# Patient Record
Sex: Male | Born: 1955 | Race: White | Hispanic: No | Marital: Married | Smoking: Current every day smoker
Health system: Southern US, Community
[De-identification: ages and names within clinical notes are randomized; demographics above are authoritative.]

## PROBLEM LIST (undated history)

## (undated) DIAGNOSIS — T7840XA Allergy, unspecified, initial encounter: Secondary | ICD-10-CM

## (undated) DIAGNOSIS — I10 Essential (primary) hypertension: Secondary | ICD-10-CM

## (undated) DIAGNOSIS — E785 Hyperlipidemia, unspecified: Secondary | ICD-10-CM

## (undated) DIAGNOSIS — I219 Acute myocardial infarction, unspecified: Secondary | ICD-10-CM

## (undated) HISTORY — DX: Allergy, unspecified, initial encounter: T78.40XA

## (undated) HISTORY — DX: Hyperlipidemia, unspecified: E78.5

## (undated) HISTORY — PX: CORONARY ARTERY BYPASS GRAFT: SHX141

## (undated) HISTORY — DX: Essential (primary) hypertension: I10

## (undated) HISTORY — DX: Acute myocardial infarction, unspecified: I21.9

## (undated) HISTORY — PX: CARDIAC SURGERY: SHX584

---

## 2008-03-31 ENCOUNTER — Emergency Department (HOSPITAL_COMMUNITY): Admission: EM | Admit: 2008-03-31 | Discharge: 2008-03-31 | Payer: Self-pay | Admitting: Emergency Medicine

## 2008-04-12 ENCOUNTER — Emergency Department (HOSPITAL_COMMUNITY): Admission: EM | Admit: 2008-04-12 | Discharge: 2008-04-12 | Payer: Self-pay | Admitting: Emergency Medicine

## 2009-11-22 ENCOUNTER — Inpatient Hospital Stay (HOSPITAL_COMMUNITY): Admission: EM | Admit: 2009-11-22 | Discharge: 2009-11-25 | Payer: Self-pay | Admitting: Emergency Medicine

## 2009-12-16 ENCOUNTER — Encounter (HOSPITAL_COMMUNITY)
Admission: RE | Admit: 2009-12-16 | Discharge: 2010-03-09 | Payer: Self-pay | Source: Home / Self Care | Admitting: Cardiology

## 2009-12-16 IMAGING — CT CT ABDOMEN W/O CM
1 of 2 series · 15 of 32 positions shown, 19 images · non-contrast
Comparison: None

CT ABDOMEN

CLINICAL DATA: Left leg pain

CT ABDOMEN AND PELVIS WITHOUT CONTRAST
TECHNIQUE: Multidetector CT imaging of the abdomen and pelvis was
performed following the standard
protocol without intravenous contrast.

[Series 2: 220 stone 5.0 b40s st · axial · 0.89mm/px · z∈[+1298,+1698]mm · 15 of 88 slices shown, 19 images]
[im 4/88  soft-tissue]
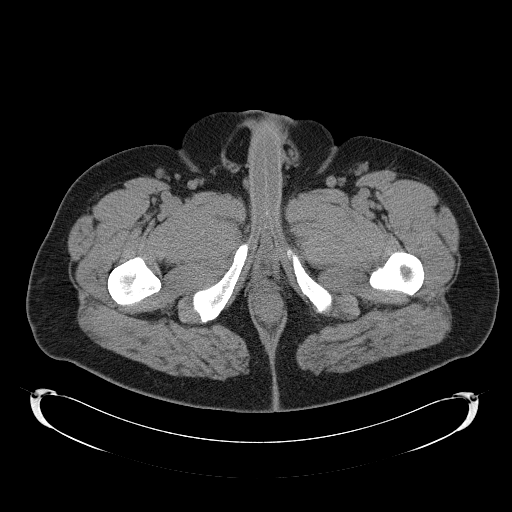
[im 4/88  bone]
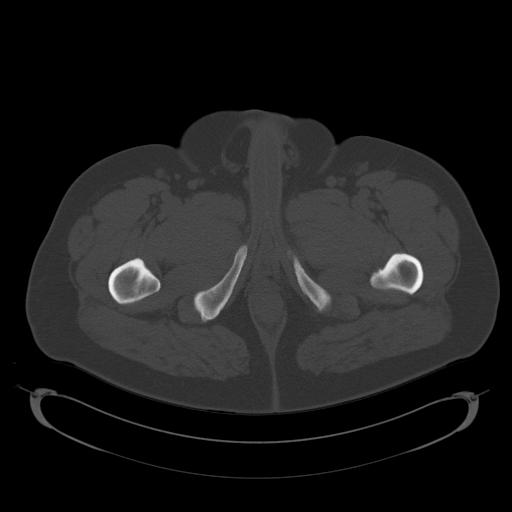
[im 12/88  soft-tissue]
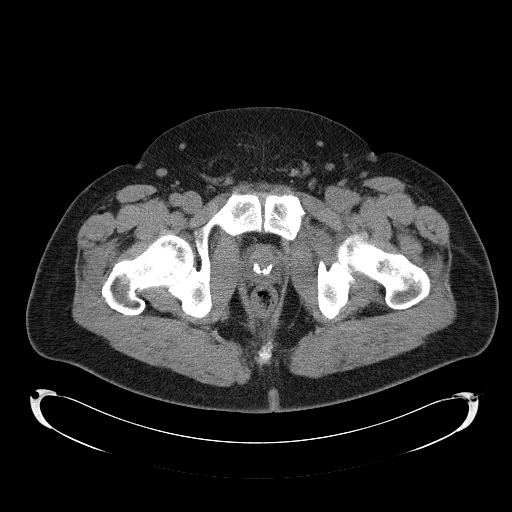
[im 20/88  soft-tissue]
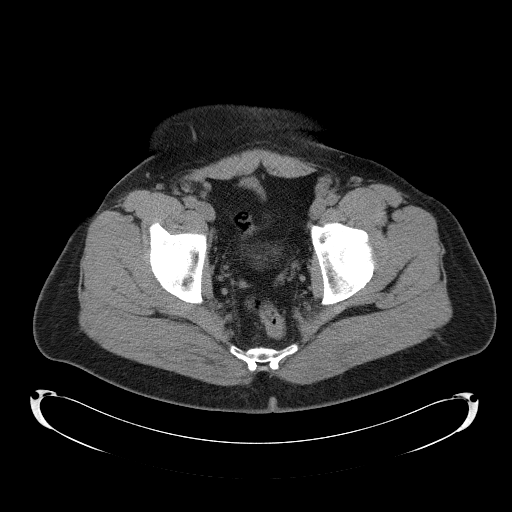
[im 24/88  soft-tissue]
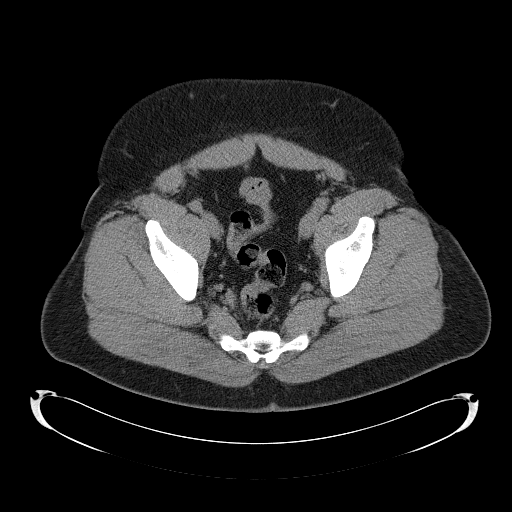
[im 32/88  soft-tissue]
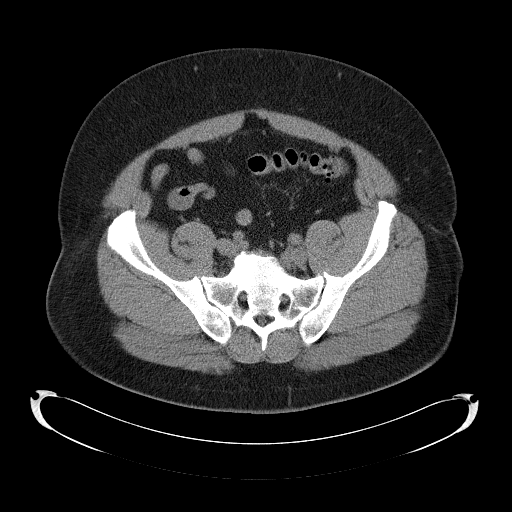
[im 36/88  soft-tissue]
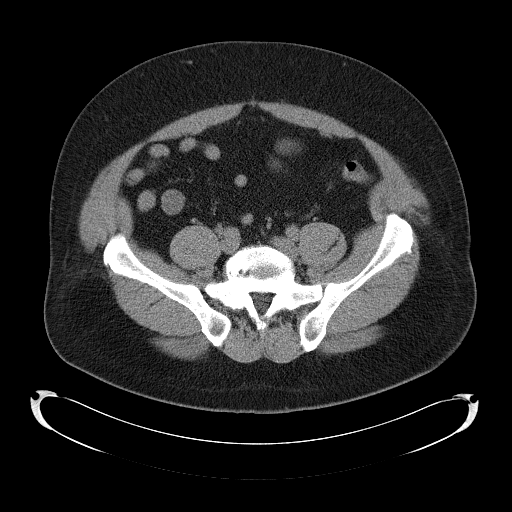
[im 44/88  soft-tissue]
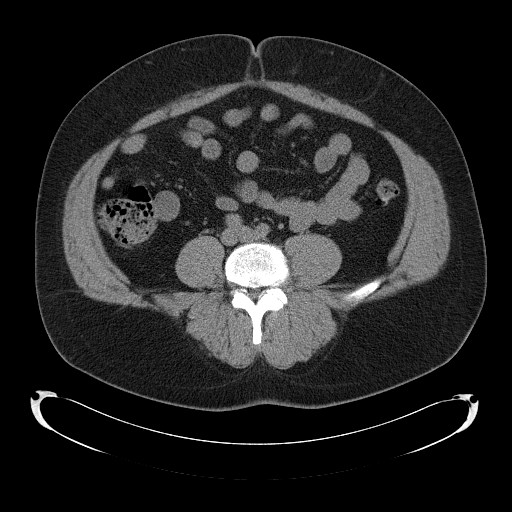
[im 52/88  soft-tissue]
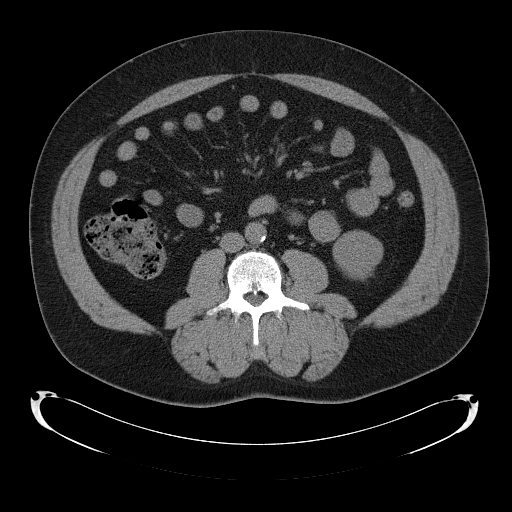
[im 56/88  soft-tissue]
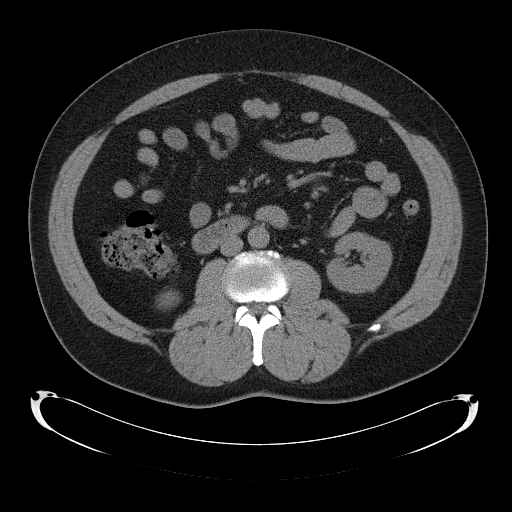
[im 56/88  bone]
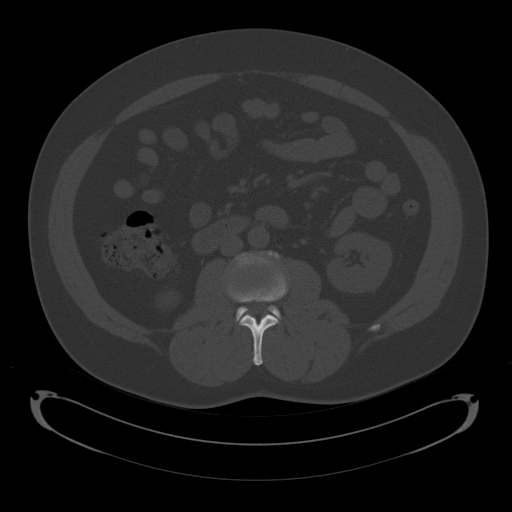
[im 64/88  soft-tissue]
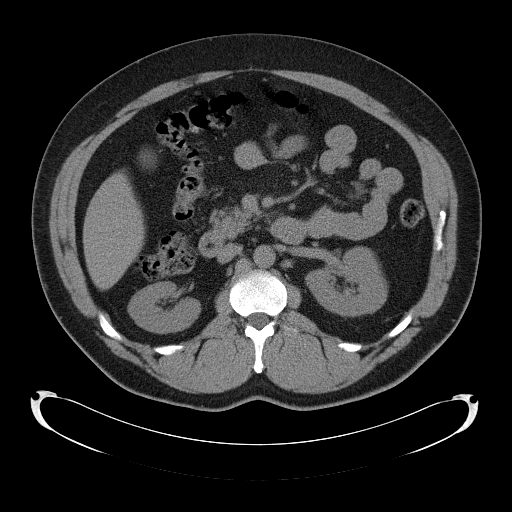
[im 68/88  soft-tissue]
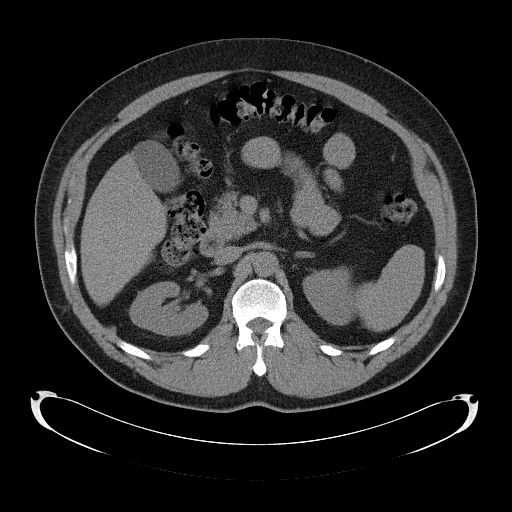
[im 72/88  lung]
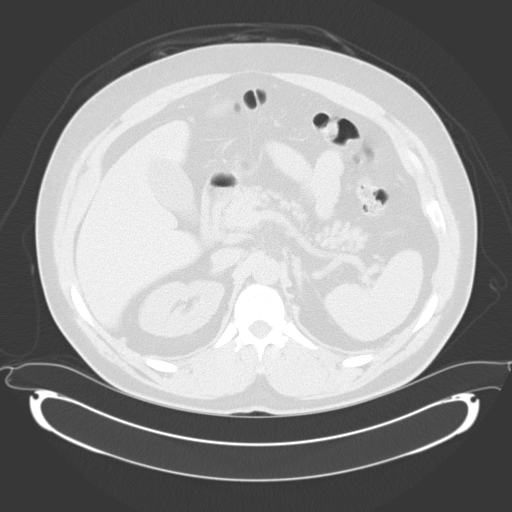
[im 76/88  soft-tissue]
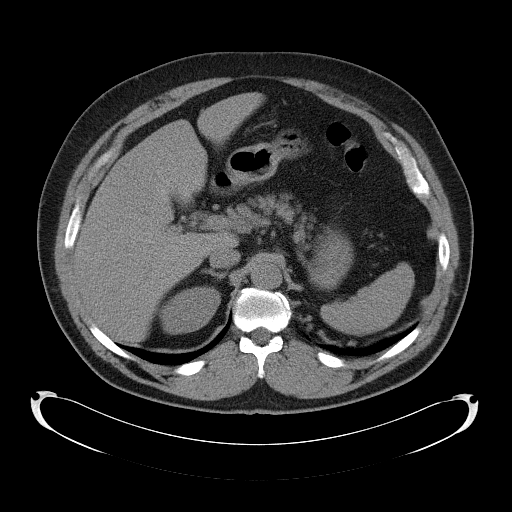
[im 76/88  lung]
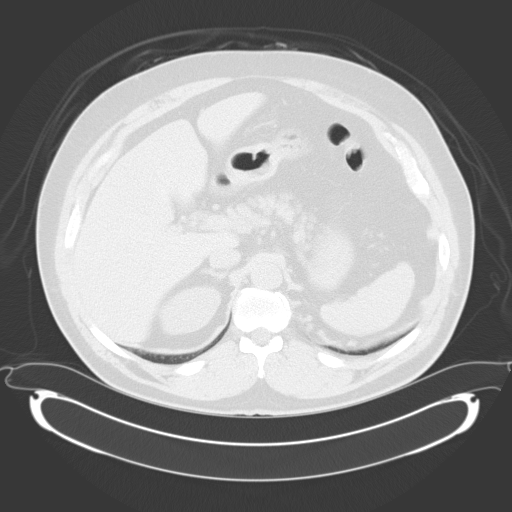
[im 80/88  lung]
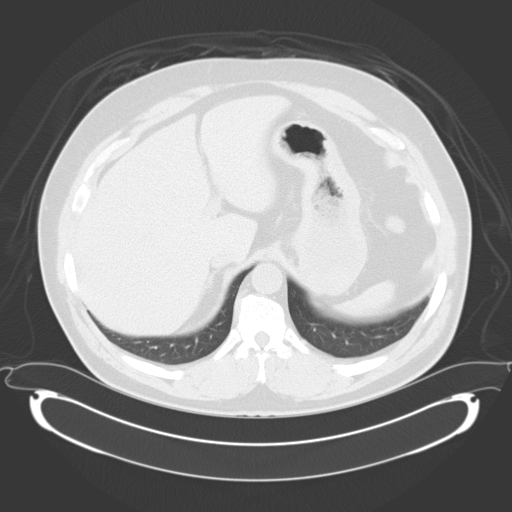
[im 84/88  soft-tissue]
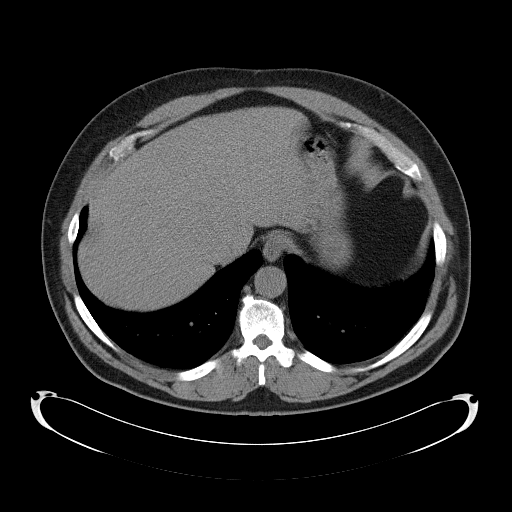
[im 84/88  lung]
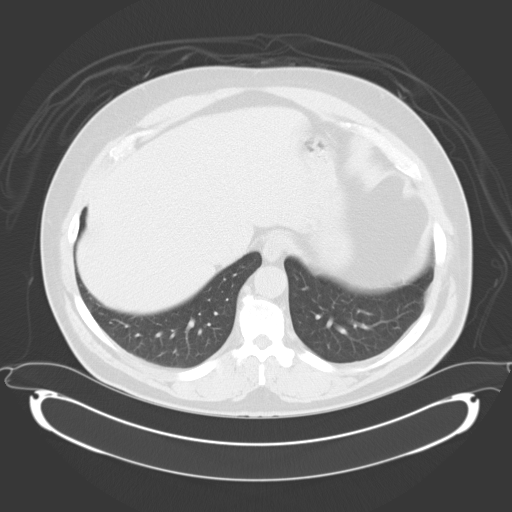

[15 of 32 positions shown; findings below may reference images not displayed]

FINDINGS: A subpleural nodule is visualized within the post
lateral segment left lower lung, image #1..  No pericardial or
pleural effusion.  The liver gallbladder and spleen are
unremarkable.  Incidental accessory spleen.

The kidneys are symmetric without hydronephrosis.  No renal calculi
are appreciated.  The ureters are nondilated.

The pancreas is normal. The adrenal glands are normal.  The
gallbladder is normal caliber.

Scattered diverticular changes seen within the descending colon.
There is subtle pericolonic mesenteric fat stranding which may
represent uncomplicated diverticulitis.

Thoracolumbar degenerative changes.
IMPRESSION: 1.  Subpleural nodule left lung post lateral basal segment.  Short-
term follow-up CT suggested.  Recommend CT chest 4- 6 weeks.
2.  Question uncomplicated diverticulitis.
3.  Negative for hydronephrosis.

CT PELVIS
FINDINGS: The distal ureters decompressed.  No distal ureteral
calculi are seen.  Few scattered vascular calcifications are noted.
The bladder is normal.  No free fluid.  Degenerative changes lower
lumbar spine.
IMPRESSION: Negative

## 2010-01-05 ENCOUNTER — Ambulatory Visit (HOSPITAL_COMMUNITY): Admission: RE | Admit: 2010-01-05 | Discharge: 2010-01-06 | Payer: Self-pay | Admitting: Cardiology

## 2010-09-19 LAB — BASIC METABOLIC PANEL
BUN: 9 mg/dL (ref 6–23)
Creatinine, Ser: 0.87 mg/dL (ref 0.4–1.5)
GFR calc non Af Amer: >60 mL/min (ref >60)
Potassium: 4 mEq/L (ref 3.5–5.1)

## 2010-09-19 LAB — CK TOTAL AND CKMB (NOT AT ARMC)
Relative Index: 3 — ABNORMAL HIGH (ref 0.0–2.5)
Total CK: 105 U/L (ref 7–232)

## 2010-09-19 LAB — CBC
HCT: 39.8 % (ref 39.0–52.0)
Hemoglobin: 13.7 g/dL (ref 13.0–17.0)
MCHC: 34.5 g/dL (ref 30.0–36.0)
MCV: 94.1 fL (ref 78.0–100.0)
Platelets: 287 10*3/uL (ref 150–400)
RBC: 4.23 MIL/uL (ref 4.22–5.81)
RDW: 12.4 % (ref 11.5–15.5)

## 2010-09-20 LAB — MAGNESIUM: Magnesium: 2.1 mg/dL (ref 1.5–2.5)

## 2010-09-20 LAB — BASIC METABOLIC PANEL
BUN: 10 mg/dL (ref 6–23)
BUN: 13 mg/dL (ref 6–23)
CO2: 25 mEq/L (ref 19–32)
CO2: 28 mEq/L (ref 19–32)
CO2: 28 mEq/L (ref 19–32)
Calcium: 8.3 mg/dL — ABNORMAL LOW (ref 8.4–10.5)
Calcium: 8.7 mg/dL (ref 8.4–10.5)
Calcium: 8.9 mg/dL (ref 8.4–10.5)
Chloride: 106 mEq/L (ref 96–112)
Chloride: 107 mEq/L (ref 96–112)
GFR calc Af Amer: >60 mL/min (ref >60)
GFR calc Af Amer: >60 mL/min (ref >60)
GFR calc non Af Amer: >60 mL/min (ref >60)
GFR calc non Af Amer: >60 mL/min (ref >60)
GFR calc non Af Amer: >60 mL/min (ref >60)
Glucose, Bld: 99 mg/dL (ref 70–99)
Potassium: 3.9 mEq/L (ref 3.5–5.1)
Potassium: 4.1 mEq/L (ref 3.5–5.1)
Potassium: 5.8 mEq/L — ABNORMAL HIGH (ref 3.5–5.1)
Sodium: 137 mEq/L (ref 135–145)
Sodium: 137 mEq/L (ref 135–145)
Sodium: 139 mEq/L (ref 135–145)

## 2010-09-20 LAB — CBC
HCT: 42.3 % (ref 39.0–52.0)
HCT: 43.7 % (ref 39.0–52.0)
Hemoglobin: 14.9 g/dL (ref 13.0–17.0)
MCHC: 35.2 g/dL (ref 30.0–36.0)
MCHC: 35.3 g/dL (ref 30.0–36.0)
MCV: 95.3 fL (ref 78.0–100.0)
RBC: 4.47 MIL/uL (ref 4.22–5.81)
RBC: 4.69 MIL/uL (ref 4.22–5.81)
WBC: 10.1 10*3/uL (ref 4.0–10.5)
WBC: 8.6 10*3/uL (ref 4.0–10.5)
WBC: 9.3 10*3/uL (ref 4.0–10.5)

## 2010-09-20 LAB — COMPREHENSIVE METABOLIC PANEL
ALT: 18 U/L (ref 0–53)
AST: 33 U/L (ref 0–37)
Alkaline Phosphatase: 43 U/L (ref 39–117)
BUN: 13 mg/dL (ref 6–23)
Creatinine, Ser: 0.92 mg/dL (ref 0.4–1.5)
GFR calc non Af Amer: >60 mL/min (ref >60)
Total Bilirubin: 0.6 mg/dL (ref 0.3–1.2)

## 2010-09-20 LAB — CARDIAC PANEL(CRET KIN+CKTOT+MB+TROPI)
CK, MB: 11.3 ng/mL (ref 0.3–4.0)
CK, MB: 2.7 ng/mL (ref 0.3–4.0)
Relative Index: 2.3 (ref 0.0–2.5)
Total CK: 176 U/L (ref 7–232)

## 2010-09-20 LAB — CK TOTAL AND CKMB (NOT AT ARMC): Total CK: 296 U/L — ABNORMAL HIGH (ref 7–232)

## 2010-09-20 LAB — LIPID PANEL
Cholesterol: 213 mg/dL — ABNORMAL HIGH (ref 0–200)
HDL: 38 mg/dL — ABNORMAL LOW (ref >39)
LDL Cholesterol: UNDETERMINED mg/dL (ref 0–99)
Triglycerides: 416 mg/dL — ABNORMAL HIGH (ref <150)

## 2010-09-20 LAB — PROTIME-INR
INR: 0.97 (ref 0.00–1.49)
Prothrombin Time: 12.8 seconds (ref 11.6–15.2)

## 2010-09-20 LAB — POCT CARDIAC MARKERS
Myoglobin, poc: 200 ng/mL (ref 12–200)
Troponin i, poc: <0.05 ng/mL (ref 0.00–0.09)

## 2010-09-20 LAB — URINALYSIS, ROUTINE W REFLEX MICROSCOPIC
Hgb urine dipstick: NEGATIVE
Ketones, ur: 15 mg/dL — AB
Nitrite: NEGATIVE
Protein, ur: NEGATIVE mg/dL
Specific Gravity, Urine: 1.027 (ref 1.005–1.030)
Urobilinogen, UA: 0.2 mg/dL (ref 0.0–1.0)

## 2010-09-20 LAB — APTT: aPTT: 63 seconds — ABNORMAL HIGH (ref 24–37)

## 2010-09-20 LAB — PLATELET INHIBITION P2Y12: Platelet Function  P2Y12: 265 [PRU] (ref 194–418)

## 2010-09-20 LAB — DIFFERENTIAL
Basophils Absolute: 0 10*3/uL (ref 0.0–0.1)
Basophils Relative: 0 % (ref 0–1)
Eosinophils Relative: 2 % (ref 0–5)
Monocytes Absolute: 1 10*3/uL (ref 0.1–1.0)
Neutrophils Relative %: 53 % (ref 43–77)

## 2010-09-20 LAB — HEPARIN LEVEL (UNFRACTIONATED): Heparin Unfractionated: 0.22 IU/mL — ABNORMAL LOW (ref 0.30–0.70)

## 2011-04-04 LAB — URINALYSIS, ROUTINE W REFLEX MICROSCOPIC
Glucose, UA: NEGATIVE
Protein, ur: NEGATIVE
Specific Gravity, Urine: 1.035 — ABNORMAL HIGH

## 2011-04-04 LAB — BASIC METABOLIC PANEL
Calcium: 9.2
Chloride: 103
Creatinine, Ser: 0.85
GFR calc Af Amer: >60
Sodium: 136

## 2011-04-05 LAB — COMPREHENSIVE METABOLIC PANEL
ALT: 31
AST: 23
Albumin: 3.5
Alkaline Phosphatase: 54
Chloride: 104
GFR calc Af Amer: >60
Potassium: 4.5
Sodium: 140
Total Bilirubin: 0.8

## 2011-04-05 LAB — CBC
Platelets: 323
WBC: 8.3

## 2011-04-05 LAB — URINALYSIS, ROUTINE W REFLEX MICROSCOPIC
Glucose, UA: NEGATIVE
Protein, ur: NEGATIVE
Specific Gravity, Urine: 1.016
Urobilinogen, UA: 0.2

## 2011-04-05 LAB — DIFFERENTIAL
Basophils Absolute: 0
Basophils Relative: 1
Eosinophils Relative: 3
Lymphocytes Relative: 35
Monocytes Absolute: 0.9

## 2012-08-10 ENCOUNTER — Telehealth: Payer: Self-pay | Admitting: Family Medicine

## 2012-08-10 ENCOUNTER — Ambulatory Visit (INDEPENDENT_AMBULATORY_CARE_PROVIDER_SITE_OTHER): Payer: BC Managed Care – PPO | Admitting: Emergency Medicine

## 2012-08-10 VITALS — BP 118/78 | HR 70 | Temp 97.7°F | Resp 18 | Ht 67.5 in | Wt 241.2 lb

## 2012-08-10 DIAGNOSIS — I2510 Atherosclerotic heart disease of native coronary artery without angina pectoris: Secondary | ICD-10-CM | POA: Insufficient documentation

## 2012-08-10 DIAGNOSIS — R110 Nausea: Secondary | ICD-10-CM

## 2012-08-10 DIAGNOSIS — R42 Dizziness and giddiness: Secondary | ICD-10-CM

## 2012-08-10 LAB — POCT URINALYSIS DIPSTICK
Leukocytes, UA: NEGATIVE
Nitrite, UA: NEGATIVE
Protein, UA: 30
Urobilinogen, UA: NEGATIVE
pH, UA: 6.5

## 2012-08-10 LAB — GLUCOSE, POCT (MANUAL RESULT ENTRY): POC Glucose: 81 mg/dl (ref 70–99)

## 2012-08-10 LAB — POCT CBC
Granulocyte percent: 50.5 %G (ref 37–80)
HCT, POC: 50.2 % (ref 43.5–53.7)
POC Granulocyte: 4.4 (ref 2–6.9)
POC LYMPH PERCENT: 42.5 %L (ref 10–50)
RDW, POC: 13.3 %

## 2012-08-10 MED ORDER — MECLIZINE HCL 25 MG PO TABS: ORAL_TABLET | ORAL | Status: DC

## 2012-08-10 NOTE — Telephone Encounter (Signed)
Called patient an lmom to give me a call back about pulse

## 2012-08-10 NOTE — Patient Instructions (Signed)
Labyrinthitis (Inner Ear Inflammation) Your exam shows you have an inner ear disturbance or labyrinthitis. The cause of this condition is not known. But it may be due to a virus infection. The symptoms of labyrinthitis include vertigo or dizziness made worse by motion, nausea and vomiting. The onset of labyrinthitis may be very sudden. It usually lasts for a few days and then clears up over 1-2 weeks. The treatment of an inner ear disturbance includes bed rest and medications to reduce dizziness, nausea, and vomiting. You should stay away from alcohol, tranquilizers, caffeine, nicotine, or any medicine your doctor thinks may make your symptoms worse. Further testing may be needed to evaluate your hearing and balance system. Please see your doctor or go to the emergency room right away if you have:  Increasing vertigo, earache, loss of hearing, or ear drainage.  Headache, blurred vision, trouble walking, fainting, or fever.  Persistent vomiting, dehydration, or extreme weakness. Document Released: 06/20/2005 Document Revised: 09/12/2011 Document Reviewed: 12/06/2006 Exit[employer] Patient Information 2013 [employer].  

## 2012-08-10 NOTE — Progress Notes (Signed)
Subjective:    Patient ID: [name] [name], male    DOB: 15-Jan-1956, 57 y.o.   MRN: [id number]  HPI Patient presents with dizziness that has been going on for a few weeks. He was exposed to the flu a few weeks ago. He wakes with dizziness that passes after about an half hour but so severe that he wants to throw up. He gets nauseated. He slept most of the day yesterday. Laying down he feels fine, when he gets up and moves around he feels dizzy. He feels some pressure in his ears bilaterally. He can't explain if the room is spinning or if he is dizzy. Reports hearing loss in his right ear. He feels his sinuses are blocked. He has a history of hearing problems that had resolved. No chest pains or arm pain. He has a history of MI, he reports no symptoms like when he had it.   Two weeks ago he saw his heart doctor. He reports that he monitors his BP and it has been good.    Review of Systems     Objective:   Physical Exam patient is alert and cooperative does not appear in any distress. The TMs are clear. Talking to the patient does appear to have some hearing loss. His throat is clear neurologically cranial nerves II through XII intact motor strength 5 out of 5 all muscle groups. Deep tendon reflexes 2+ and symmetrical. Results for orders placed in visit on 08/10/12  POCT CBC      Component Value Range   WBC 8.7  4.6 - 10.2 K/uL   Lymph, poc 3.7 (*) 0.6 - 3.4   POC LYMPH PERCENT 42.5  10 - 50 %L   MID (cbc) 0.6  0 - 0.9   POC MID % 7.0  0 - 12 %M   POC Granulocyte 4.4  2 - 6.9   Granulocyte percent 50.5  37 - 80 %G   RBC 5.19  4.69 - 6.13 M/uL   Hemoglobin 16.4  14.1 - 18.1 g/dL   HCT, POC 14.7  82.9 - 53.7 %   MCV 96.8  80 - 97 fL   MCH, POC 31.6 (*) 27 - 31.2 pg   MCHC 32.7  31.8 - 35.4 g/dL   RDW, POC 56.2     Platelet Count, POC 398  142 - 424 K/uL   MPV 8.3  0 - 99.8 fL  GLUCOSE, POCT (MANUAL RESULT ENTRY)      Component Value Range   POC Glucose 81  70 - 99 mg/dl  POCT  URINALYSIS DIPSTICK      Component Value Range   Color, UA amber     Clarity, UA cloudy     Glucose, UA neg     Bilirubin, UA small     Ketones, UA trace     Spec Grav, UA 1.025     Blood, UA neg     pH, UA 6.5     Protein, UA 30     Urobilinogen, UA negative     Nitrite, UA neg     Leukocytes, UA Negative     EKG no acute changes small inferior Q.s Audiogram  mild high frequency loss bilaterally     Assessment & Plan:  Patient history of cardiac disease. He his symptoms currently sound more like inner ear. He does have a buzzing sensation in his right ear associated with hearing loss and vertigo raising the possibility of Mnire's disease. We'll check a CBC  glucose and EKG today. He does have a mild amount of high frequency hearing loss by audiogram. We'll treat with Antivert for now he is to call me in the next 48 hours with a progress report . We'll proceed with MRI if symptoms are persistent

## 2012-10-31 ENCOUNTER — Ambulatory Visit (INDEPENDENT_AMBULATORY_CARE_PROVIDER_SITE_OTHER): Payer: BC Managed Care – PPO | Admitting: Family Medicine

## 2012-10-31 VITALS — BP 128/84 | HR 56 | Temp 97.6°F | Resp 16 | Ht 67.5 in | Wt 245.0 lb

## 2012-10-31 DIAGNOSIS — R42 Dizziness and giddiness: Secondary | ICD-10-CM

## 2012-10-31 DIAGNOSIS — H9311 Tinnitus, right ear: Secondary | ICD-10-CM

## 2012-10-31 MED ORDER — MECLIZINE HCL 25 MG PO TABS: ORAL_TABLET | ORAL | Status: DC

## 2012-10-31 NOTE — Patient Instructions (Addendum)
Rest, drink plenty of fluids and use the meclizine as needed for dizziness. If you continue to have a lot of vomiting please call us or come back in. Let us know if your symptoms change or otherwise get worse.   I am going to refer you for an MRI of your head.

## 2012-10-31 NOTE — Progress Notes (Signed)
[facility] [address] [place] [address] [phone]  Date:  10/31/2012   Name:  [name] [name]   DOB:  [birth year]   MRN:  [id number]  PCP:  No primary provider on file.    Chief Complaint: Dizziness and Emesis   History of Present Illness:  [name] [name] is a 57 y.o. very pleasant male patient who presents with the following:  He was here on 08/10/2012 with dizziness for a few weeks.  He had nausea with the dizziness.  Felt ok at rest- movement would cause dizziness.  He was given antivert, with plan for MRI if symptoms continued and he was asked to call in 48 hours with an update.  However, I do not see where we heard from him.  He states that he got better, but the dizziness has come back a couple of times in between his last visit and now.  He is here with symptoms again today.   He reports that he is having about the same symptoms today as he was prior to his last visit.  He noted the dizziness again this am when he woke up.  He feels off balance, and then will develop nausea.  He has been vomiting today- just when he got to clinic.  If he holds very still the symptoms quiet.    No HA, no CP.  He drove himself here today He has not used any meclizine today.  He ran out of what he had from before.   He still notes some pressure and buzzing in his right ear.  This has been intermittently present since he was here in February  He ate a banana and had some coffee this am.    Patient Active Problem List   Diagnosis Date Noted  . CAD (coronary artery disease) 08/10/2012    Past Medical History  Diagnosis Date  . Myocardial infarction   . Hypertension     No past surgical history on file.  History  Substance Use Topics  . Smoking status: Current Every Day Smoker    Types: Cigarettes  . Smokeless tobacco: Not on file  . Alcohol Use: Not on file    Family History  Problem Relation Age of Onset  . Heart disease Father   . Hyperlipidemia  Brother   . Hypertension Brother   . Cancer Mother     Allergies  Allergen Reactions  . Septra (Sulfamethoxazole W-Trimethoprim) Shortness Of Breath and Rash    Medication list has been reviewed and updated.  Current Outpatient Prescriptions on File Prior to Visit  Medication Sig Dispense Refill  . aspirin EC 325 MG tablet Take 325 mg by mouth daily.      Marland Kitchen atorvastatin (LIPITOR) 80 MG tablet Take 80 mg by mouth daily.      . metoprolol tartrate (LOPRESSOR) 25 MG tablet Take 25 mg by mouth 2 (two) times daily.      . nitroGLYCERIN (NITROSTAT) 0.4 MG SL tablet Place 0.4 mg under the tongue every 5 (five) minutes as needed. Prescription is expired.      Marland Kitchen OVER THE COUNTER MEDICATION Takes Pepcid OTC 20 mg QD      . prasugrel (EFFIENT) 10 MG TABS Take 10 mg by mouth.      . meclizine (ANTIVERT) 25 MG tablet Take one half to one tablet 3 times a day for dizziness.  30 tablet  0   No current facility-administered medications on file prior to visit.  Review of Systems:  As per HPI- otherwise negative.   Physical Examination: Filed Vitals:   10/31/12 0949  BP: 128/84  Pulse: 56  Temp: 97.6 F (36.4 C)  Resp: 16   Filed Vitals:   10/31/12 0949  Height: 5' 7.5" (1.715 m)  Weight: 245 lb (111.131 kg)   Body mass index is 37.78 kg/(m^2). Ideal Body Weight: Weight in (lb) to have BMI = 25: 161.7  GEN: WDWN, NAD, Non-toxic, A & O x 3, obese, dry heaving into basin.  He laid down and rested prior to proceeding with exam and felt a lot better HEENT: Atraumatic, Normocephalic. Neck supple. No masses, No LAD.  Bilateral TM wnl, oropharynx normal.  PEERL,EOMI.   Ears and Nose: No external deformity. CV: RRR, No M/G/R. No JVD. No thrill. No extra heart sounds. PULM: CTA B, no wheezes, crackles, rhonchi. No retractions. No resp. distress. No accessory muscle use. ABD: S, NT, ND, +BS. No rebound. No HSM. EXTR: No c/c/e NEURO Normal gait- steady and even. Normal neuro exam- normal  strength, sensation and DTR all extremities.   PSYCH: Normally interactive. Conversant. Not depressed or anxious appearing.  Calm demeanor.   Raza rested in clinic for about 20 minutes and his nausea passed.  He felt a lot better.  Was able to complete neuro exam without difficulty.  No active vertigo when he left clinic.     Assessment and Plan: Dizziness - Plan: meclizine (ANTIVERT) 25 MG tablet, MR Brain W Wo Contrast  Tinnitus, right - Plan: MR Brain W Wo Contrast  Intermittent vertigo with tinnitus over the last 2 months.  MRI needed to evaluate further.  Deanta would like to do his MRI on Friday, which is reasonable.  rx meclizine to use as needed.  Cautioned him not to drive if he feels dizzy or weak.  Follow-up if getting worse, if persistent vomiting, or if symptom change in any other way  Signed Abbe Amsterdam, MD

## 2012-11-01 ENCOUNTER — Telehealth: Payer: Self-pay | Admitting: Radiology

## 2012-11-01 ENCOUNTER — Telehealth: Payer: Self-pay | Admitting: Family Medicine

## 2012-11-01 NOTE — Telephone Encounter (Signed)
Will you work on this, patient wants on Friday

## 2012-11-01 NOTE — Telephone Encounter (Signed)
Message copied by Caffie Damme on Thu Nov 01, 2012  9:55 AM ------      Message from: Abbe Amsterdam C      Created: Wed Oct 31, 2012  5:41 PM       Please work on getting MRI scheduled for this pt.  He would like to do on Friday 11/02/12 ------

## 2012-11-01 NOTE — Telephone Encounter (Signed)
Spoke with [name] [name] this morning- he is feeling better, less dizzy today.  We will work on getting his MRI for tomorrow.  Asked again about any metal in his body- he is only aware of his cardiac stents, states he has not had any valve or aneurysm work.  He will let us know if any problems today.

## 2012-11-02 ENCOUNTER — Ambulatory Visit (HOSPITAL_COMMUNITY)
Admission: RE | Admit: 2012-11-02 | Discharge: 2012-11-02 | Disposition: A | Discharge status: Home / Self Care | Payer: BC Managed Care – PPO | Source: Ambulatory Visit | Attending: Family Medicine | Admitting: Family Medicine

## 2012-11-02 ENCOUNTER — Other Ambulatory Visit: Payer: Self-pay | Admitting: Family Medicine

## 2012-11-02 DIAGNOSIS — R42 Dizziness and giddiness: Secondary | ICD-10-CM

## 2012-11-02 DIAGNOSIS — R5381 Other malaise: Secondary | ICD-10-CM | POA: Insufficient documentation

## 2012-11-02 DIAGNOSIS — H9311 Tinnitus, right ear: Secondary | ICD-10-CM

## 2012-11-02 DIAGNOSIS — R110 Nausea: Secondary | ICD-10-CM | POA: Insufficient documentation

## 2012-11-02 DIAGNOSIS — I10 Essential (primary) hypertension: Secondary | ICD-10-CM | POA: Insufficient documentation

## 2012-11-02 DIAGNOSIS — R5383 Other fatigue: Secondary | ICD-10-CM | POA: Insufficient documentation

## 2012-11-02 DIAGNOSIS — H9190 Unspecified hearing loss, unspecified ear: Secondary | ICD-10-CM | POA: Insufficient documentation

## 2012-11-02 LAB — POCT I-STAT, CHEM 8
BUN: 14 mg/dL (ref 6–23)
Chloride: 104 mEq/L (ref 96–112)
Sodium: 140 mEq/L (ref 135–145)

## 2012-11-02 MED ORDER — GADOBENATE DIMEGLUMINE 529 MG/ML IV SOLN
20.0000 mL | Freq: Once | INTRAVENOUS | Status: AC | PRN
  Administered 2012-11-02: 20 mL via INTRAVENOUS

## 2012-11-03 ENCOUNTER — Telehealth: Payer: Self-pay | Admitting: Family Medicine

## 2012-11-03 NOTE — Telephone Encounter (Signed)
Called regarding his MRI-  LMOM.  Results reassuring.  Will CB later to go over details

## 2012-11-04 NOTE — Telephone Encounter (Signed)
Called and LMOM again.  Asked him to please try to call tomorrow if convenient for him

## 2012-11-05 ENCOUNTER — Encounter: Payer: Self-pay | Admitting: Family Medicine

## 2012-11-05 ENCOUNTER — Telehealth: Payer: Self-pay | Admitting: Family Medicine

## 2012-11-05 DIAGNOSIS — R42 Dizziness and giddiness: Secondary | ICD-10-CM

## 2012-11-05 NOTE — Telephone Encounter (Signed)
Discussed with him- MRI looks ok.  Plan referral to ENT

## 2012-11-06 NOTE — Telephone Encounter (Signed)
Thanks this has been done

## 2012-12-24 DIAGNOSIS — Z0271 Encounter for disability determination: Secondary | ICD-10-CM

## 2013-01-01 ENCOUNTER — Ambulatory Visit (INDEPENDENT_AMBULATORY_CARE_PROVIDER_SITE_OTHER): Payer: BC Managed Care – PPO | Admitting: Internal Medicine

## 2013-01-01 ENCOUNTER — Ambulatory Visit: Payer: BC Managed Care – PPO

## 2013-01-01 VITALS — BP 122/84 | HR 69 | Temp 97.4°F | Resp 18 | Ht 68.0 in | Wt 251.0 lb

## 2013-01-01 DIAGNOSIS — S39012A Strain of muscle, fascia and tendon of lower back, initial encounter: Secondary | ICD-10-CM

## 2013-01-01 DIAGNOSIS — F17200 Nicotine dependence, unspecified, uncomplicated: Secondary | ICD-10-CM

## 2013-01-01 DIAGNOSIS — IMO0002 Reserved for concepts with insufficient information to code with codable children: Secondary | ICD-10-CM

## 2013-01-01 MED ORDER — CYCLOBENZAPRINE HCL 10 MG PO TABS: 10.0000 mg | ORAL_TABLET | Freq: Three times a day (TID) | ORAL | Status: DC | PRN

## 2013-01-01 MED ORDER — HYDROCODONE-ACETAMINOPHEN 5-325 MG PO TABS: 1.0000 | ORAL_TABLET | Freq: Four times a day (QID) | ORAL | Status: DC | PRN

## 2013-01-01 NOTE — Patient Instructions (Signed)
Dolor de espalda en el adulto  (Back Pain, Adult)   El dolor de cintura es frecuente. Aproximadamente 1 de cada 5 personas lo sufren. La causa rara vez pone en peligro la vida. Con frecuencia mejora luego de algún tiempo. Alrededor de la mitad de las personas que sufren un inicio súbito de dolor de cintura, se sentirán mejor luego de 2 semanas. Aproximadamente 8 de cada 10 se sentirán mejor luego de 6 semanas.   CAUSAS   Algunas causas comunes son:   · Distensión de los músculos o ligamentos que sostienen la columna vertebral.  · Desgaste (degeneración) de los discos vertebrales.  · Artritis.  · Traumatismos directos en la espalda.  DIAGNÓSTICO   La mayor parte de las veces, la causa directa no se conoce. Sin embargo, el dolor puede tratarse efectivamente aún cuando no se conozca la causa. Una de las formas más precisas de asegurar que la causa del dolor no constituye un peligro es responder a las preguntas del médico acerca de su salud y sus síntomas. Si el médico necesita más información, podrá indicar análisis de laboratorio o realizar un diagnóstico por imágenes (radiografías o resonancia magnética). Sin embargo, aunque las imágenes muestren modificaciones, generalmente no es necesaria la cirugía.   INSTRUCCIONES PARA EL CUIDADO EN EL HOGAR   En algunas personas, el dolor de espalda vuelve. Como rara vez es peligroso, los pacientes pueden aprender a manejarlo ellos mismos.   · Manténgase activo. Si permanece sentado o de pie mucho tiempo en el mismo lugar, se tensiona la espalda.  No se siente, maneje ni se quede parado en un mismo lugar por más de 30 minutos. Realice caminatas cortas en superficies planas ni bien el dolor haya cedido. Trate de aumentar cada día el tiempo que camina .  · No se quede en la cama. Si hace reposo durante más de 1 o 2 días, puede retrasar la recuperación.  · No evite los ejercicios ni el trabajo. El cuerpo está hecho para moverse. No es peligroso estar activo, aunque le duela la  espalda. La espalda se curará más rápido si continúa sus actividades antes de que el dolor se vaya.  · Preste atención a su cuerpo cuando se incline y se levante. Muchas personas sienten menos molestias cuando levantan objetos si doblan las rodillas, mantienen la carga cerca del cuerpo y evitan torcerse. Generalmente, las posiciones más cómodas son las que ejercen menos tensión en la espalda en recuperación.  · Encuentre una posición cómoda para dormir. Utilice un colchón firme y recuéstese de costado. Doble ligeramente sus rodillas. Si se recuesta sobre su espalda, coloque una almohada debajo de sus rodillas.  · Tome sólo medicamentos de venta libre o recetados, según las indicaciones del médico. Los medicamentos de venta libre para calmar el dolor y reducir la inflamación, son los que en general más ayudan. El médico podrá prescribirle relajantes musculares. Estos medicamentos calman el dolor de modo que pueda retornar a sus actividades normales y a realizar ejercicios saludables.  · Aplique hielo sobre la zona lesionada.  · Ponga el hielo en una bolsa plástica.  · Colóquese una toalla entre la piel y la bolsa de hielo.  · Deje la bolsa de hielo durante 15 a 20 minutos 3 a 4 veces por día, durante los primeros 2 ó 3 días. Luego podrá alternar entre calor y hielo para reducir el dolor y los espasmos.  · Consulte a su médico si puede tratar de hacer ejercicios para la espalda y recibir un masaje suave. Pueden ser beneficiosos.  · Evite sentirse ansioso o   estresado. El estrés aumenta la tensión muscular y puede empeorar el dolor de espalda. Es importante reconocer cuando está ansioso o estresado y aprender la forma de controlarlos. El ejercicio es una gran opción.  SOLICITE ATENCIÓN MÉDICA SI:   · Siente un dolor que no se alivia con reposo o medicamentos.  · El dolor no mejora en 1 semana.  · Desarrolla nuevos síntomas.  · No se siente bien en general.  SOLICITE ATENCIÓN MÉDICA DE INMEDIATO SI:  · Siente un dolor  que se irradia desde la espalda hacia sus piernas.  · Desarrolla nuevos problemas en el intestino o la vejiga.  · Siente debilidad o adormecimiento inusual en sus brazos o piernas.  · Presenta náuseas o vómitos.  · Presenta dolor abdominal.  · Se siente desfalleciente.  Document Released: 06/20/2005 Document Revised: 12/20/2011  Exit[employer]® Patient Information ©2014 [employer].

## 2013-01-01 NOTE — Progress Notes (Signed)
  Subjective:    Patient ID: [name] [name], male    DOB: 11-20-1955, 57 y.o.   MRN: [id number]  HPI At home working on tire and twisted funny and injured back. In severe spasm, but no radiation, weakness, numbness, incontinence. Hx of rare spells like this.   Review of Systems Smoker and hx of MI    Objective:   Physical Exam  Vitals reviewed. Constitutional: He is oriented to person, place, and time. He appears well-nourished. He appears distressed.  Eyes: EOM are normal.  Neck: Neck supple.  Cardiovascular: Normal rate.   Pulmonary/Chest: Effort normal.  Abdominal: There is no tenderness.  Musculoskeletal: He exhibits tenderness.       Lumbar back: He exhibits decreased range of motion, tenderness, deformity, pain and spasm. He exhibits no bony tenderness, no swelling, no edema and normal pulse.  Neurological: He is alert and oriented to person, place, and time. He has normal strength and normal reflexes. No sensory deficit. Coordination and gait abnormal.      [facility] reading (PRIMARY) by  Dr Perrin Maltese lumbar spine mild spondylosis      Assessment & Plan:  Back strain and spasm Vicodin/Flexeril Back care Rec quit smoking

## 2013-01-07 ENCOUNTER — Ambulatory Visit (INDEPENDENT_AMBULATORY_CARE_PROVIDER_SITE_OTHER): Payer: BC Managed Care – PPO | Admitting: Family Medicine

## 2013-01-07 VITALS — BP 128/72 | HR 76 | Temp 97.8°F | Resp 18 | Ht 68.0 in | Wt 257.8 lb

## 2013-01-07 DIAGNOSIS — IMO0002 Reserved for concepts with insufficient information to code with codable children: Secondary | ICD-10-CM

## 2013-01-07 DIAGNOSIS — S39012S Strain of muscle, fascia and tendon of lower back, sequela: Secondary | ICD-10-CM

## 2013-01-07 DIAGNOSIS — S39012A Strain of muscle, fascia and tendon of lower back, initial encounter: Secondary | ICD-10-CM

## 2013-01-07 DIAGNOSIS — F17200 Nicotine dependence, unspecified, uncomplicated: Secondary | ICD-10-CM

## 2013-01-07 MED ORDER — HYDROCODONE-ACETAMINOPHEN 5-325 MG PO TABS: 2.0000 | ORAL_TABLET | Freq: Four times a day (QID) | ORAL | Status: DC | PRN

## 2013-01-07 MED ORDER — PREDNISONE 20 MG PO TABS: ORAL_TABLET | ORAL | Status: DC

## 2013-01-07 NOTE — Progress Notes (Signed)
[facility] [address], [place] [address] [phone]  Date:  01/07/2013   Name:  [name] [name]   DOB:  04/01/1956   MRN:  [id number]  PCP:  No primary provider on file.    Chief Complaint: Follow-up   History of Present Illness:  [name] [name] is a 57 y.o. very pleasant male patient who presents with the following:  Here today to recheck a back problem.  He was here on 01/01/13 with back spasm.  He was treated with vicodin and flexeril, and had back x-rays which showed the following: IMPRESSION:  Slight scoliosis. Changes of degenerative disc disease and  degenerative spondylosis. Apophyseal joint degenerative  spondylosis L5-S1. No fracture or bony destruction. Moderate  fecal distention of portions of the colon.   He did feel better a few days after he started the medication.  He has been using a cane, and does feel that he is better.  However, his improvement seems to have stopped over the last 4 days.  His back seems "swollen."  He noted this at the time of the original injury.as well.      He has rarely had back trouble in the past.  He cannot use ibuprofen due to his heart problems.  He has some pain into his bilateral thighs for the last 3 days.   No numbness or weakness, "just pain."  He is not dizzy.   He does not have any bowel or bladder dysfunction.   History of PCI in 2011.    Patient Active Problem List   Diagnosis Date Noted  . CAD (coronary artery disease) 08/10/2012    Past Medical History  Diagnosis Date  . Myocardial infarction   . Hypertension     History reviewed. No pertinent past surgical history.  History  Substance Use Topics  . Smoking status: Current Every Day Smoker    Types: Cigarettes  . Smokeless tobacco: Not on file  . Alcohol Use: Not on file    Family History  Problem Relation Age of Onset  . Heart disease Father   . Hyperlipidemia Brother   . Hypertension Brother   . Cancer Mother      Allergies  Allergen Reactions  . Ceftin (Cefuroxime Axetil)     rash    Medication list has been reviewed and updated.  Current Outpatient Prescriptions on File Prior to Visit  Medication Sig Dispense Refill  . aspirin EC 325 MG tablet Take 325 mg by mouth daily.      Marland Kitchen atorvastatin (LIPITOR) 80 MG tablet Take 80 mg by mouth daily.      . cyclobenzaprine (FLEXERIL) 10 MG tablet Take 1 tablet (10 mg total) by mouth 3 (three) times daily as needed for muscle spasms.  30 tablet  0  . HYDROcodone-acetaminophen (NORCO/VICODIN) 5-325 MG per tablet Take 1 tablet by mouth every 6 (six) hours as needed for pain.  30 tablet  0  . meclizine (ANTIVERT) 25 MG tablet Take one half to one tablet 3 times a day for dizziness.  60 tablet  0  . metoprolol tartrate (LOPRESSOR) 25 MG tablet Take 25 mg by mouth 2 (two) times daily.      . nitroGLYCERIN (NITROSTAT) 0.4 MG SL tablet Place 0.4 mg under the tongue every 5 (five) minutes as needed. Prescription is expired.      Marland Kitchen OVER THE COUNTER MEDICATION Takes Pepcid OTC 20 mg QD      . prasugrel (EFFIENT) 10 MG  TABS Take 10 mg by mouth.       No current facility-administered medications on file prior to visit.    Review of Systems:  As per HPI- otherwise negative.   Physical Examination: Filed Vitals:   01/07/13 0851  BP: 128/72  Pulse: 76  Temp: 97.8 F (36.6 C)  Resp: 18   Filed Vitals:   01/07/13 0851  Height: 5\' 8"  (1.727 m)  Weight: 257 lb 12.8 oz (116.937 kg)   Body mass index is 39.21 kg/(m^2). Ideal Body Weight: Weight in (lb) to have BMI = 25: 164.1  GEN: WDWN, NAD, Non-toxic, A & O x 3, overweight HEENT: Atraumatic, Normocephalic. Neck supple. No masses, No LAD. Ears and Nose: No external deformity. CV: RRR, No M/G/R. No JVD. No thrill. No extra heart sounds. PULM: CTA B, no wheezes, crackles, rhonchi. No retractions. No resp. distress. No accessory muscle use. ABD: S, NT, ND, no hip flexor tenderness  EXTR: No  c/c/e NEURO carrying a cane but walking fairly normally.  Somewhat stiff PSYCH: Normally interactive. Conversant. Not depressed or anxious appearing.  Calm demeanor.  Back: he has limited flexion, limited extension.  No palpable tenderness.  Normal SLR on both sides.   He has normal strength and sensation both legs, normal DTR    Assessment and Plan: Back strain, sequela  Back strain, initial encounter - Plan: predniSONE (DELTASONE) 20 MG tablet, HYDROcodone-acetaminophen (NORCO/VICODIN) 5-325 MG per tablet  Nicotine addiction  Persistent back strain, possible mild nerve impingement.  Will treat with prednisone, and refilled his hydrocodone pills.   Close follow- up if not better   Signed Abbe Amsterdam, MD

## 2013-01-07 NOTE — Patient Instructions (Addendum)
You may take up to two of your pain pills up to every 6 hours if needed.   Use the prednisone as directed.  If you are not feeling better in the next couple of days please let me know-Sooner if worse.

## 2013-01-08 DIAGNOSIS — Z0271 Encounter for disability determination: Secondary | ICD-10-CM

## 2013-02-10 ENCOUNTER — Ambulatory Visit: Payer: BC Managed Care – PPO

## 2013-02-10 ENCOUNTER — Ambulatory Visit (INDEPENDENT_AMBULATORY_CARE_PROVIDER_SITE_OTHER): Payer: BC Managed Care – PPO | Admitting: Emergency Medicine

## 2013-02-10 VITALS — BP 132/80 | HR 76 | Temp 97.9°F | Resp 15 | Ht 67.5 in | Wt 242.0 lb

## 2013-02-10 DIAGNOSIS — M79609 Pain in unspecified limb: Secondary | ICD-10-CM

## 2013-02-10 DIAGNOSIS — M79672 Pain in left foot: Secondary | ICD-10-CM

## 2013-02-10 LAB — POCT CBC
Lymph, poc: 2.4 (ref 0.6–3.4)
MCH, POC: 32.2 pg — AB (ref 27–31.2)
MCHC: 32.5 g/dL (ref 31.8–35.4)
MID (cbc): 0.6 (ref 0–0.9)
MPV: 8.4 fL (ref 0–99.8)
POC MID %: 8.5 %M (ref 0–12)
Platelet Count, POC: 352 10*3/uL (ref 142–424)
RDW, POC: 13.2 %
WBC: 6.5 10*3/uL (ref 4.6–10.2)

## 2013-02-10 MED ORDER — MELOXICAM 15 MG PO TABS: 15.0000 mg | ORAL_TABLET | Freq: Every day | ORAL | Status: DC

## 2013-02-10 NOTE — Progress Notes (Signed)
[facility] [address], [place] [address] [phone]  Date:  02/10/2013   Name:  [name] [name]   DOB:  05/29/1956   MRN:  [id number]  PCP:  No primary provider on file.    Chief Complaint: Foot Pain and Toe Pain   History of Present Illness:  [name] [name] is a 57 y.o. very pleasant male patient who presents with the following:  Pain in left foot three weeks ago.  No history of injury or overuse.  Attributes the pain to wearing two pair of socks with his work boots.  No history of gout but has gout in family.  Pain worse with ambulation.  [name] and thinks is swollen.  No improvement with over the counter medications or other home remedies. Denies other complaint or health concern today.    Patient Active Problem List   Diagnosis Date Noted  . CAD (coronary artery disease) 08/10/2012    Past Medical History  Diagnosis Date  . Myocardial infarction   . Hypertension   . Allergy   . Hyperlipidemia     Past Surgical History  Procedure Laterality Date  . Coronary artery bypass graft      History  Substance Use Topics  . Smoking status: Current Every Day Smoker -- 0.40 packs/day    Types: Cigarettes    Start date: 02/11/1983  . Smokeless tobacco: Never Used  . Alcohol Use: Not on file    Family History  Problem Relation Age of Onset  . Heart disease Father   . Hyperlipidemia Brother   . Hypertension Brother   . Cancer Mother     Allergies  Allergen Reactions  . Ceftin (Cefuroxime Axetil) Rash    Shortness of breath    Medication list has been reviewed and updated.  Current Outpatient Prescriptions on File Prior to Visit  Medication Sig Dispense Refill  . aspirin EC 325 MG tablet Take 325 mg by mouth daily.      Marland Kitchen atorvastatin (LIPITOR) 80 MG tablet Take 80 mg by mouth daily.      . meclizine (ANTIVERT) 25 MG tablet Take one half to one tablet 3 times a day for dizziness.  60 tablet  0  . metoprolol tartrate (LOPRESSOR) 25 MG  tablet Take 25 mg by mouth 2 (two) times daily.      . nitroGLYCERIN (NITROSTAT) 0.4 MG SL tablet Place 0.4 mg under the tongue every 5 (five) minutes as needed. Prescription is expired.      Marland Kitchen OVER THE COUNTER MEDICATION Takes Pepcid OTC 20 mg QD      . prasugrel (EFFIENT) 10 MG TABS Take 10 mg by mouth.      . cyclobenzaprine (FLEXERIL) 10 MG tablet Take 1 tablet (10 mg total) by mouth 3 (three) times daily as needed for muscle spasms.  30 tablet  0  . HYDROcodone-acetaminophen (NORCO/VICODIN) 5-325 MG per tablet Take 2 tablets by mouth every 6 (six) hours as needed for pain. Take just 1 if adequate  30 tablet  0  . predniSONE (DELTASONE) 20 MG tablet Take 2 pills for 3 days, then 1 pill for 3 days  9 tablet  0   No current facility-administered medications on file prior to visit.    Review of Systems: As per HPI, otherwise negative.    Physical Examination: Filed Vitals:   02/10/13 0907  BP: 132/80  Pulse: 76  Temp: 97.9 F (36.6 C)  Resp: 15   Filed Vitals:  02/10/13 0907  Height: 5' 7.5" (1.715 m)  Weight: 242 lb (109.77 kg)   Body mass index is 37.32 kg/(m^2). Ideal Body Weight: Weight in (lb) to have BMI = 25: 161.7  GEN: WDWN, NAD, Non-toxic, Alert & Oriented x 3 HEENT: Atraumatic, Normocephalic.  Ears and Nose: No external deformity. EXTR: No clubbing/cyanosis/edema NEURO: Normal gait.  PSYCH: Normally interactive. Conversant. Not depressed or anxious appearing.  Calm demeanor.  LEFT foot:  Erythematous left foot no tenderness or ecchymosis.  No deformity.  Tender and erythematous great toe.    Assessment and Plan:  LEFT FOOT PAIN mobic   signed,  Phillips Odor, MD   Results for orders placed in visit on 02/10/13  POCT CBC      Result Value Range   WBC 6.5  4.6 - 10.2 K/uL   Lymph, poc 2.4  0.6 - 3.4   POC LYMPH PERCENT 36.9  10 - 50 %L   MID (cbc) 0.6  0 - 0.9   POC MID % 8.5  0 - 12 %M   POC Granulocyte 3.5  2 - 6.9   Granulocyte percent  54.6  37 - 80 %G   RBC 4.91  4.69 - 6.13 M/uL   Hemoglobin 15.8  14.1 - 18.1 g/dL   HCT, POC 86.5  78.4 - 53.7 %   MCV 98.9 (*) 80 - 97 fL   MCH, POC 32.2 (*) 27 - 31.2 pg   MCHC 32.5  31.8 - 35.4 g/dL   RDW, POC 69.6     Platelet Count, POC 352  142 - 424 K/uL   MPV 8.4  0 - 99.8 fL    [facility] reading (PRIMARY) by  Dr. Dareen Piano.  Negative foot.

## 2013-12-30 ENCOUNTER — Encounter (HOSPITAL_COMMUNITY): Payer: Self-pay | Admitting: Emergency Medicine

## 2013-12-30 ENCOUNTER — Inpatient Hospital Stay (HOSPITAL_COMMUNITY)
Admission: EM | Admit: 2013-12-30 | Discharge: 2014-01-01 | DRG: 247 | Disposition: A | Discharge status: Home / Self Care | Payer: BC Managed Care – PPO | Attending: Cardiology | Admitting: Cardiology

## 2013-12-30 ENCOUNTER — Ambulatory Visit (INDEPENDENT_AMBULATORY_CARE_PROVIDER_SITE_OTHER): Payer: BC Managed Care – PPO | Admitting: Emergency Medicine

## 2013-12-30 DIAGNOSIS — I214 Non-ST elevation (NSTEMI) myocardial infarction: Secondary | ICD-10-CM

## 2013-12-30 DIAGNOSIS — I249 Acute ischemic heart disease, unspecified: Secondary | ICD-10-CM

## 2013-12-30 DIAGNOSIS — Z9861 Coronary angioplasty status: Secondary | ICD-10-CM

## 2013-12-30 DIAGNOSIS — Z955 Presence of coronary angioplasty implant and graft: Secondary | ICD-10-CM

## 2013-12-30 DIAGNOSIS — I200 Unstable angina: Secondary | ICD-10-CM | POA: Diagnosis present

## 2013-12-30 DIAGNOSIS — R079 Chest pain, unspecified: Secondary | ICD-10-CM

## 2013-12-30 DIAGNOSIS — E785 Hyperlipidemia, unspecified: Secondary | ICD-10-CM | POA: Diagnosis present

## 2013-12-30 DIAGNOSIS — I252 Old myocardial infarction: Secondary | ICD-10-CM

## 2013-12-30 DIAGNOSIS — I2510 Atherosclerotic heart disease of native coronary artery without angina pectoris: Principal | ICD-10-CM | POA: Diagnosis present

## 2013-12-30 DIAGNOSIS — I10 Essential (primary) hypertension: Secondary | ICD-10-CM | POA: Diagnosis present

## 2013-12-30 DIAGNOSIS — I2582 Chronic total occlusion of coronary artery: Secondary | ICD-10-CM | POA: Diagnosis present

## 2013-12-30 DIAGNOSIS — Z8249 Family history of ischemic heart disease and other diseases of the circulatory system: Secondary | ICD-10-CM

## 2013-12-30 DIAGNOSIS — F17200 Nicotine dependence, unspecified, uncomplicated: Secondary | ICD-10-CM | POA: Diagnosis present

## 2013-12-30 DIAGNOSIS — Z951 Presence of aortocoronary bypass graft: Secondary | ICD-10-CM

## 2013-12-30 DIAGNOSIS — Z7982 Long term (current) use of aspirin: Secondary | ICD-10-CM

## 2013-12-30 LAB — COMPREHENSIVE METABOLIC PANEL
ALK PHOS: 50 U/L (ref 39–117)
ALT: 22 U/L (ref 0–53)
AST: 20 U/L (ref 0–37)
Albumin: 3.3 g/dL — ABNORMAL LOW (ref 3.5–5.2)
BUN: 13 mg/dL (ref 6–23)
CO2: 28 mEq/L (ref 19–32)
CREATININE: 0.8 mg/dL (ref 0.50–1.35)
Calcium: 8.8 mg/dL (ref 8.4–10.5)
Chloride: 102 mEq/L (ref 96–112)
GFR calc Af Amer: >90 mL/min (ref >90)
GFR calc non Af Amer: >90 mL/min (ref >90)
GLUCOSE: 168 mg/dL — AB (ref 70–99)
Potassium: 3.7 mEq/L (ref 3.7–5.3)
Sodium: 139 mEq/L (ref 137–147)
Total Bilirubin: 0.3 mg/dL (ref 0.3–1.2)
Total Protein: 6.3 g/dL (ref 6.0–8.3)

## 2013-12-30 LAB — I-STAT TROPONIN, ED: Troponin i, poc: 0.1 ng/mL (ref 0.00–0.08)

## 2013-12-30 LAB — CBC WITH DIFFERENTIAL/PLATELET
Basophils Absolute: 0 10*3/uL (ref 0.0–0.1)
Basophils Relative: 0 % (ref 0–1)
EOS ABS: 0.2 10*3/uL (ref 0.0–0.7)
EOS PCT: 2 % (ref 0–5)
HCT: 43.6 % (ref 39.0–52.0)
HEMOGLOBIN: 14.8 g/dL (ref 13.0–17.0)
Lymphocytes Relative: 38 % (ref 12–46)
Lymphs Abs: 4.1 10*3/uL — ABNORMAL HIGH (ref 0.7–4.0)
MCH: 31.7 pg (ref 26.0–34.0)
MCHC: 33.9 g/dL (ref 30.0–36.0)
MCV: 93.4 fL (ref 78.0–100.0)
MONOS PCT: 8 % (ref 3–12)
Monocytes Absolute: 0.8 10*3/uL (ref 0.1–1.0)
Neutro Abs: 5.5 10*3/uL (ref 1.7–7.7)
Neutrophils Relative %: 52 % (ref 43–77)
Platelets: 279 10*3/uL (ref 150–400)
RBC: 4.67 MIL/uL (ref 4.22–5.81)
RDW: 13.1 % (ref 11.5–15.5)
WBC: 10.6 10*3/uL — ABNORMAL HIGH (ref 4.0–10.5)

## 2013-12-30 LAB — CBC
HEMATOCRIT: 45.9 % (ref 39.0–52.0)
HEMOGLOBIN: 15.3 g/dL (ref 13.0–17.0)
MCH: 31.6 pg (ref 26.0–34.0)
MCHC: 33.3 g/dL (ref 30.0–36.0)
MCV: 94.8 fL (ref 78.0–100.0)
Platelets: 282 10*3/uL (ref 150–400)
RBC: 4.84 MIL/uL (ref 4.22–5.81)
RDW: 13 % (ref 11.5–15.5)
WBC: 8.9 10*3/uL (ref 4.0–10.5)

## 2013-12-30 LAB — BASIC METABOLIC PANEL
BUN: 12 mg/dL (ref 6–23)
CHLORIDE: 103 meq/L (ref 96–112)
CO2: 26 meq/L (ref 19–32)
CREATININE: 0.77 mg/dL (ref 0.50–1.35)
Calcium: 8.7 mg/dL (ref 8.4–10.5)
GFR calc non Af Amer: >90 mL/min (ref >90)
Glucose, Bld: 92 mg/dL (ref 70–99)
POTASSIUM: 4.2 meq/L (ref 3.7–5.3)
Sodium: 139 mEq/L (ref 137–147)

## 2013-12-30 LAB — APTT: aPTT: 47 seconds — ABNORMAL HIGH (ref 24–37)

## 2013-12-30 LAB — HEPARIN LEVEL (UNFRACTIONATED): HEPARIN UNFRACTIONATED: 0.15 [IU]/mL — AB (ref 0.30–0.70)

## 2013-12-30 LAB — PROTIME-INR
INR: 1.02 (ref 0.00–1.49)
Prothrombin Time: 13.4 seconds (ref 11.6–15.2)

## 2013-12-30 LAB — TROPONIN I: Troponin I: <0.3 ng/mL (ref <0.30)

## 2013-12-30 LAB — MRSA PCR SCREENING: MRSA BY PCR: NEGATIVE

## 2013-12-30 LAB — TSH: TSH: 1.88 u[IU]/mL (ref 0.350–4.500)

## 2013-12-30 LAB — MAGNESIUM: MAGNESIUM: 1.9 mg/dL (ref 1.5–2.5)

## 2013-12-30 MED ORDER — ONDANSETRON HCL 4 MG/2ML IJ SOLN: 4.0000 mg | Freq: Four times a day (QID) | INTRAMUSCULAR | Status: DC | PRN

## 2013-12-30 MED ORDER — ASPIRIN 81 MG PO CHEW: 324.0000 mg | CHEWABLE_TABLET | ORAL | Status: AC

## 2013-12-30 MED ORDER — SODIUM CHLORIDE 0.9 % IJ SOLN
3.0000 mL | Freq: Two times a day (BID) | INTRAMUSCULAR | Status: DC
Start: 2013-12-30 — End: 2013-12-31
  Administered 2013-12-30: 3 mL via INTRAVENOUS

## 2013-12-30 MED ORDER — SODIUM CHLORIDE 0.9 % IV SOLN
1.0000 mL/kg/h | INTRAVENOUS | Status: DC
Start: 2013-12-30 — End: 2013-12-31
  Administered 2013-12-31: 1 mL/kg/h via INTRAVENOUS

## 2013-12-30 MED ORDER — PRASUGREL HCL 10 MG PO TABS
10.0000 mg | ORAL_TABLET | ORAL | Status: AC
  Filled 2013-12-30: qty 1

## 2013-12-30 MED ORDER — SODIUM CHLORIDE 0.9 % IV SOLN
INTRAVENOUS | Status: DC
  Administered 2013-12-30: 23:00:00 via INTRAVENOUS

## 2013-12-30 MED ORDER — SODIUM CHLORIDE 0.9 % IJ SOLN: 3.0000 mL | INTRAMUSCULAR | Status: DC | PRN

## 2013-12-30 MED ORDER — NITROGLYCERIN 0.4 MG SL SUBL: 0.4000 mg | SUBLINGUAL_TABLET | SUBLINGUAL | Status: DC | PRN

## 2013-12-30 MED ORDER — ASPIRIN 300 MG RE SUPP
300.0000 mg | RECTAL | Status: AC
  Filled 2013-12-30: qty 1

## 2013-12-30 MED ORDER — ATORVASTATIN CALCIUM 80 MG PO TABS
80.0000 mg | ORAL_TABLET | Freq: Every day | ORAL | Status: DC
  Administered 2013-12-31 – 2014-01-01 (×2): 80 mg via ORAL
  Filled 2013-12-30 (×3): qty 1

## 2013-12-30 MED ORDER — NITROGLYCERIN IN D5W 200-5 MCG/ML-% IV SOLN: 5.0000 ug/min | INTRAVENOUS | Status: DC

## 2013-12-30 MED ORDER — HEPARIN BOLUS VIA INFUSION
4000.0000 [IU] | Freq: Once | INTRAVENOUS | Status: AC
  Administered 2013-12-30 (×2): 4000 [IU] via INTRAVENOUS
  Filled 2013-12-30: qty 4000

## 2013-12-30 MED ORDER — ASPIRIN 81 MG PO CHEW
81.0000 mg | CHEWABLE_TABLET | ORAL | Status: AC
  Administered 2013-12-31: 81 mg via ORAL
  Filled 2013-12-30: qty 1

## 2013-12-30 MED ORDER — ASPIRIN EC 81 MG PO TBEC
81.0000 mg | DELAYED_RELEASE_TABLET | Freq: Every day | ORAL | Status: DC
  Filled 2013-12-30 (×2): qty 1

## 2013-12-30 MED ORDER — SODIUM CHLORIDE 0.9 % IV SOLN: 250.0000 mL | INTRAVENOUS | Status: DC | PRN

## 2013-12-30 MED ORDER — PANTOPRAZOLE SODIUM 40 MG PO TBEC
40.0000 mg | DELAYED_RELEASE_TABLET | Freq: Every day | ORAL | Status: DC
  Administered 2013-12-31 – 2014-01-01 (×2): 40 mg via ORAL
  Filled 2013-12-30 (×2): qty 1

## 2013-12-30 MED ORDER — PRASUGREL HCL 10 MG PO TABS
10.0000 mg | ORAL_TABLET | Freq: Every day | ORAL | Status: DC
  Administered 2014-01-01: 10 mg via ORAL
  Filled 2013-12-30 (×3): qty 1

## 2013-12-30 MED ORDER — HEPARIN (PORCINE) IN NACL 100-0.45 UNIT/ML-% IJ SOLN
1600.0000 [IU]/h | INTRAMUSCULAR | Status: DC
  Administered 2013-12-30: 1300 [IU]/h via INTRAVENOUS
  Administered 2013-12-30: 1600 [IU]/h via INTRAVENOUS
  Filled 2013-12-30 (×3): qty 250

## 2013-12-30 MED ORDER — ACETAMINOPHEN 325 MG PO TABS: 650.0000 mg | ORAL_TABLET | ORAL | Status: DC | PRN

## 2013-12-30 MED ORDER — HEPARIN BOLUS VIA INFUSION
2500.0000 [IU] | Freq: Once | INTRAVENOUS | Status: AC
  Administered 2013-12-30: 2500 [IU] via INTRAVENOUS
  Filled 2013-12-30: qty 2500

## 2013-12-30 MED ORDER — METOPROLOL TARTRATE 12.5 MG HALF TABLET
12.5000 mg | ORAL_TABLET | Freq: Two times a day (BID) | ORAL | Status: DC
  Administered 2013-12-30 – 2014-01-01 (×4): 12.5 mg via ORAL
  Filled 2013-12-30 (×6): qty 1

## 2013-12-30 NOTE — ED Notes (Signed)
Attempted report x1. 

## 2013-12-30 NOTE — Progress Notes (Signed)
   Subjective:    Patient ID: [name] [name], male    DOB: [birth year], 58 y.o.   MRN: [id number]  HPI patient with a history of coronary artery disease . Presents with onset at 2 AM of substernal chest discomfort. Patient was able to go to sleep but when he awakened at 9 he continued to have chest discomfort with radiation into his jaw. He has some diaphoresis but no nausea no shortness of breath. He took an X. to aspirin this morning.    Review of Systems     Objective:   Physical Exam physical exam reveals a somewhat diaphoretic male who is not in any distress. His chest clear heart regular no murmurs abdomen was soft nontender.  EKG normal sinus rhythm       Assessment & Plan:  The patient presents with a 10 hour history of substernal chest discomfort with radiation into the jaw. He has a history of coronary artery disease. He was stabilized and sent to the hospital for evaluation. He was given a full 325 mg ASA prior to leaving.

## 2013-12-30 NOTE — Progress Notes (Signed)
ANTICOAGULATION CONSULT NOTE - Initial Consult  Pharmacy Consult for Heparin Indication: chest pain/ACS  Allergies  Allergen Reactions  . Ceftin [Cefuroxime Axetil] Rash    Shortness of breath    Patient Measurements: Height: 5\' 7"  (170.2 cm) Weight: 236 lb (107.049 kg) IBW/kg (Calculated) : 66.1 Heparin Dosing Weight: 90 kg  Vital Signs: Temp: 98.4 F (36.9 C) (06/29 1430) Temp src: Oral (06/29 1430) BP: 111/71 mmHg (06/29 1435) Pulse Rate: 61 (06/29 1435)  Labs: No results found for this basename: HGB, HCT, PLT, APTT, LABPROT, INR, HEPARINUNFRC, CREATININE, CKTOTAL, CKMB, TROPONINI,  in the last 72 hours  Estimated Creatinine Clearance: 105.7 ml/min (by C-G formula based on Cr of 0.9).   Medical History: Past Medical History  Diagnosis Date  . Myocardial infarction   . Hypertension   . Allergy   . Hyperlipidemia     Medications:  ASA 325, atorvastatin, metoprolol, NTG SL prn, Pepcid OTC, prasugrel  Assessment: 58 y.o. male presents with CP. Noted h/o MI 2012. To begin heparin for r/o ACS. Baseline labs pending.  Goal of Therapy:  Heparin level 0.3-0.7 units/ml Monitor platelets by anticoagulation protocol: Yes   Plan:  1. Heparin IV bolus 4000 units 2. Heparin IV gtt at 1300 units/hr 3. Will f/u 6 hr heparin level 4. Daily heparin level and CBC  Christoper Fabianaron Amend, PharmD, BCPS Clinical pharmacist, pager [phone] 12/30/2013,3:26 PM

## 2013-12-30 NOTE — ED Notes (Addendum)
Dr. Lady DeutscherHarwarni, Cardiology at bedside, states to hold Nitro and states to give if pt has CP and systolic BP is above 120.

## 2013-12-30 NOTE — H&P (Signed)
[name] [name] is an 58 y.o. male.   Chief Complaint: Chest pain radiating to jaw HPI: Patient is 58 year old male with past medical history significant for coronary artery disease history of silent anteroseptal wall MI subsequently had also history of inferior wall MI status post PCI to RCA and attempted PCI 200% occluded distal LAD in the past, hypertension, hypercholesteremia, and glucose intolerance, history of tobacco abuse, history of nonsustained VT in the past, positive family history of coronary artery disease, history of erectile dysfunction, was transferred from urgent care to [facility] hospital the ER. Patient complains of retrosternal burning chest pain which started around 2 AM radiating to the jaw associated with diaphoresis off and on went to sleep woke up again at 9 AM with chest pain grade 6/10 associated with mild diaphoresis patient took aspirin with partial relief and was started on heparin and nitroglycerin with relief of chest pain in ED patient was noted to have minimally elevated troponin I. her point-of-care. Patient denies any shortness of breath denies palpitation lightheadedness or syncope. Denies such episodes of chest pain in recent past. Patient states chest pain feels similar in nature when he had MI in the past. Denies and chest pain at present. Denies history of recent rest or nocturnal angina.  Past Medical History  Diagnosis Date  . Myocardial infarction   . Hypertension   . Allergy   . Hyperlipidemia     Past Surgical History  Procedure Laterality Date  . Coronary artery bypass graft    . Cardiac surgery      Family History  Problem Relation Age of Onset  . Heart disease Father   . Hyperlipidemia Brother   . Hypertension Brother   . Cancer Mother    Social History:  reports that he has been smoking Cigarettes.  He started smoking about 30 years ago. He has been smoking about 0.40 packs per day. He has never used smokeless tobacco. He reports that he  drinks alcohol. His drug history is not on file.  Allergies:  Allergies  Allergen Reactions  . Ceftin [Cefuroxime Axetil] Rash    Shortness of breath     (Not in a hospital admission)  Results for orders placed during the hospital encounter of 12/30/13 (from the past 48 hour(s))  CBC     Status: None   Collection Time    12/30/13  3:14 PM      Result Value Ref Range   WBC 8.9  4.0 - 10.5 K/uL   RBC 4.84  4.22 - 5.81 MIL/uL   Hemoglobin 15.3  13.0 - 17.0 g/dL   HCT 45.9  39.0 - 52.0 %   MCV 94.8  78.0 - 100.0 fL   MCH 31.6  26.0 - 34.0 pg   MCHC 33.3  30.0 - 36.0 g/dL   RDW 13.0  11.5 - 15.5 %   Platelets 282  150 - 400 K/uL  BASIC METABOLIC PANEL     Status: None   Collection Time    12/30/13  3:14 PM      Result Value Ref Range   Sodium 139  137 - 147 mEq/L   Potassium 4.2  3.7 - 5.3 mEq/L   Chloride 103  96 - 112 mEq/L   CO2 26  19 - 32 mEq/L   Glucose, Bld 92  70 - 99 mg/dL   BUN 12  6 - 23 mg/dL   Creatinine, Ser 0.77  0.50 - 1.35 mg/dL   Calcium 8.7  8.4 -  10.5 mg/dL   GFR calc non Af Amer >90  >90 mL/min   GFR calc Af Amer >90  >90 mL/min   Comment: (NOTE)     The eGFR has been calculated using the CKD EPI equation.     This calculation has not been validated in all clinical situations.     eGFR's persistently <90 mL/min signify possible Chronic Kidney     Disease.  TROPONIN I     Status: None   Collection Time    12/30/13  3:14 PM      Result Value Ref Range   Troponin I <0.30  <0.30 ng/mL   Comment:            Due to the release kinetics of cTnI,     a negative result within the first hours     of the onset of symptoms does not rule out     myocardial infarction with certainty.     If myocardial infarction is still suspected,     repeat the test at appropriate intervals.  Randolm Idol, ED     Status: Abnormal   Collection Time    12/30/13  3:30 PM      Result Value Ref Range   Troponin i, poc 0.10 (*) 0.00 - 0.08 ng/mL   Comment NOTIFIED  PHYSICIAN     Comment 3            Comment: Due to the release kinetics of cTnI,     a negative result within the first hours     of the onset of symptoms does not rule out     myocardial infarction with certainty.     If myocardial infarction is still suspected,     repeat the test at appropriate intervals.   No results found.  Review of Systems  Constitutional: Negative for fever and chills.  Eyes: Negative for blurred vision and double vision.  Respiratory: Negative for cough, hemoptysis, sputum production and shortness of breath.   Cardiovascular: Positive for chest pain. Negative for palpitations, orthopnea, claudication, leg swelling and PND.  Gastrointestinal: Negative for nausea, vomiting and abdominal pain.  Genitourinary: Negative for dysuria and urgency.  Neurological: Negative for dizziness and headaches.    Blood pressure 101/64, pulse 52, temperature 97.9 F (36.6 C), temperature source Oral, resp. rate 14, height $RemoveBe'5\' 7"'QhDWNwmen$  (1.702 m), weight 107.049 kg (236 lb), SpO2 97.00%. Physical Exam  Constitutional: He is oriented to person, place, and time.  HENT:  Head: Normocephalic.  Eyes: Conjunctivae are normal. Left eye exhibits no discharge. No scleral icterus.  Neck: Normal range of motion. Neck supple. No JVD present. No tracheal deviation present. No thyromegaly present.  Cardiovascular: Normal rate and regular rhythm.   Murmur (Soft systolic murmur and S4 gallop noted) heard. Respiratory: Breath sounds normal. No respiratory distress. He has no wheezes. He has no rales.  Musculoskeletal: He exhibits no edema and no tenderness.  Neurological: He is alert and oriented to person, place, and time.     Assessment/Plan Acute coronary syndrome Coronary artery disease history of inferior wall MI status post PCI to RCA in the past History of silent anteroseptal MI status post attempted PCI to LAD in the past Hypertension Glucose intolerance Hypercholesteremia History of  tobacco abuse Erectile dysfunction History of nonsustained VT in the past Also family history of coronary artery disease Plan As per orders Discussed with patient at length regarding cardiac cath possible PTCA stenting its risk and benefits i.e. death MI  stroke need for emergency CABG local last complications etc. and consented for PCI.  Clent Demark 12/30/2013, 5:31 PM

## 2013-12-30 NOTE — ED Provider Notes (Signed)
CSN: [id number]     Arrival date & time 12/30/13  1416 History   First MD Initiated Contact with Patient 12/30/13 1500     Chief Complaint  Patient presents with  . Chest Pain     (Consider location/radiation/quality/duration/timing/severity/associated sxs/prior Treatment) HPI Comments: 58 year old male with past medical history significant for coronary artery disease status post inferior wall MI with stenting and multivessel coronary artery disease with occluded distal LAD, hypertension, hypercholesteremia, tobacco abuse, positive family history of coronary artery disease, comes in to the ER with cc of chest pain. Chest pain is left sided and radiates to his jaw. The pain was severe, and also had burning nature to it. Some of the symptoms were similar to his previous MI (jaw pain). Pt had associated diophoresis. Pain is non radiating. There is no numbness, tingling, dizziness, vision complains. Pt's severe pain has resolved - and currently has some jaw discomfort and mild chest pain - 2/10. Pt given full dose of ASA by his PCP already. Nitro did alleviate some of his pain.   Patient is a 58 y.o. male presenting with chest pain. The history is provided by the patient and medical records.  Chest Pain Associated symptoms: diaphoresis   Associated symptoms: no abdominal pain, no cough and no shortness of breath     Past Medical History  Diagnosis Date  . Myocardial infarction   . Hypertension   . Allergy   . Hyperlipidemia    Past Surgical History  Procedure Laterality Date  . Coronary artery bypass graft    . Cardiac surgery     Family History  Problem Relation Age of Onset  . Heart disease Father   . Hyperlipidemia Brother   . Hypertension Brother   . Cancer Mother    History  Substance Use Topics  . Smoking status: Current Every Day Smoker -- 0.40 packs/day    Types: Cigarettes    Start date: 02/11/1983  . Smokeless tobacco: Never Used  . Alcohol Use: Yes     Comment:  occasional    Review of Systems  Constitutional: Positive for diaphoresis. Negative for activity change and appetite change.  Respiratory: Negative for cough and shortness of breath.   Cardiovascular: Positive for chest pain.  Gastrointestinal: Negative for abdominal pain.  Genitourinary: Negative for dysuria.  All other systems reviewed and are negative.     Allergies  Ceftin  Home Medications   Prior to Admission medications   Medication Sig Start Date End Date Taking? Authorizing Provider  aspirin EC 325 MG tablet Take 325 mg by mouth daily.   Yes Historical Provider, MD  atorvastatin (LIPITOR) 80 MG tablet Take 80 mg by mouth daily.   Yes Historical Provider, MD  metoprolol tartrate (LOPRESSOR) 25 MG tablet Take 25 mg by mouth 2 (two) times daily.   Yes Historical Provider, MD  OVER THE COUNTER MEDICATION Takes Pepcid OTC 20 mg QD   Yes Historical Provider, MD  prasugrel (EFFIENT) 10 MG TABS Take 10 mg by mouth.   Yes Historical Provider, MD  nitroGLYCERIN (NITROSTAT) 0.4 MG SL tablet Place 0.4 mg under the tongue every 5 (five) minutes as needed. Prescription is expired.    Historical Provider, MD   BP 105/57  Pulse 60  Temp(Src) 98.4 F (36.9 C) (Oral)  Resp 17  Ht 5\' 7"  (1.702 m)  Wt 236 lb (107.049 kg)  BMI 36.95 kg/m2  SpO2 97% Physical Exam  Nursing note and vitals reviewed. Constitutional: He is oriented to person,  place, and time. He appears well-developed.  HENT:  Head: Normocephalic and atraumatic.  Eyes: Conjunctivae and EOM are normal. Pupils are equal, round, and reactive to light.  Neck: Normal range of motion. Neck supple.  Cardiovascular: Normal rate, regular rhythm and intact distal pulses.   Murmur heard. Pulmonary/Chest: Effort normal and breath sounds normal.  Abdominal: Soft. Bowel sounds are normal. He exhibits no distension. There is no tenderness. There is no rebound and no guarding.  Neurological: He is alert and oriented to person, place,  and time.  Skin: Skin is warm. He is diaphoretic.    ED Course  Procedures (including critical care time) Labs Review Labs Reviewed  I-STAT TROPOININ, ED - Abnormal; Notable for the following:    Troponin i, poc 0.10 (*)    All other components within normal limits  CBC  BASIC METABOLIC PANEL  TROPONIN I  HEPARIN LEVEL (UNFRACTIONATED)    Imaging Review No results found.   EKG Interpretation   Date/Time:  Monday December 30 2013 14:25:53 EDT Ventricular Rate:  61 PR Interval:  148 QRS Duration: 100 QT Interval:  398 QTC Calculation: 401 R Axis:   55 Text Interpretation:  Sinus rhythm Low voltage, precordial leads No acute  findings, no new changes Reconfirmed by Rhunette CroftNANAVATI, MD, Janey GentaANKIT [phone] on  12/30/2013 3:29:57 PM Also confirmed by Rhunette CroftNANAVATI, MD, Janey GentaANKIT [phone])  on  12/30/2013 3:30:18 PM      Date: 12/30/2013  Rate: 61  Rhythm: normal sinus rhythm  QRS Axis: normal  Intervals: normal  ST/T Wave abnormalities: normal  Conduction Disutrbances: none  Narrative Interpretation: unremarkable, no acute changes and no new findings     MDM   Final diagnoses:  ACS (acute coronary syndrome)  NSTEMI (non-ST elevated myocardial infarction)    Differential diagnosis includes: ACS syndrome CHF exacerbation Valvular disorder Myocarditis Pericarditis Pericardial effusion Pneumonia Pleural effusion Pulmonary edema PE Musculoskeletal pain Dissection  Pt with hx of CAD with chest pain and jaw pain. Pain improved, but still present. No chest pain leading up to today, and no recent provocative testing. Pt has known obstructive lesions and a stent placed for inferior MI in 2011. EKG shows no acutre changes, but the hx is suggestive of ACS.  Dr. Sharyn LullHarwani will admit.  Nitro and Heparin started in the ER. ASA full dose already given to the patient by pcp. Effient given per Dr. Annitta JerseyHarwani's request.  Smoking cessation instruction/counseling given:  counseled patient on the  dangers of tobacco use, advised patient to stop smoking, and reviewed strategies to maximize success . Discussion for > 3 minutes.  CRITICAL CARE Performed by: Derwood KaplanNanavati, Ankit   Total critical care time: 45 min  Critical care time was exclusive of separately billable procedures and treating other patients.  Critical care was necessary to treat or prevent imminent or life-threatening deterioration.  Critical care was time spent personally by me on the following activities: development of treatment plan with patient and/or surrogate as well as nursing, discussions with consultants, evaluation of patient's response to treatment, examination of patient, obtaining history from patient or surrogate, ordering and performing treatments and interventions, ordering and review of laboratory studies, ordering and review of radiographic studies, pulse oximetry and re-evaluation of patient's condition.    Derwood KaplanAnkit Nanavati, MD 12/30/13 1624

## 2013-12-30 NOTE — ED Notes (Signed)
Pt presents via [facility] with sudden onset left sided chest pain starting at 0200 this AM. Pt states that it woke him up again around 0900 with jaw pain, lightheadedness, and diaphoresis. Pt denies LOC, ShOB, n/v. Pt has history of MI in 2012 and states "this feels 3 times worse/" Pt was given 1 nitro from EMS with some relief. Pt was also given 162 of ASA at urgent care after taking 162 at home this AM. Pt currently c/o 2/10 CO. VSS, NAD.

## 2013-12-30 NOTE — Progress Notes (Signed)
ANTICOAGULATION CONSULT NOTE - Follow-up Consult  Pharmacy Consult for Heparin Indication: chest pain/ACS  Allergies  Allergen Reactions  . Ceftin [Cefuroxime Axetil] Rash    Shortness of breath    Patient Measurements: Height: 5\' 7"  (170.2 cm) Weight: 236 lb (107.049 kg) IBW/kg (Calculated) : 66.1 Heparin Dosing Weight: 90 kg  Vital Signs: Temp: 97.9 F (36.6 C) (06/29 2029) Temp src: Oral (06/29 2029) BP: 136/78 mmHg (06/29 1845) Pulse Rate: 58 (06/29 1845)  Labs:  Recent Labs  12/30/13 1514 12/30/13 2130  HGB 15.3 14.8  HCT 45.9 43.6  PLT 282 279  APTT  --  47*  LABPROT  --  13.4  INR  --  1.02  HEPARINUNFRC  --  0.15*  CREATININE 0.77  --   TROPONINI <0.30  --     Estimated Creatinine Clearance: 118.9 ml/min (by C-G formula based on Cr of 0.77).  Assessment: 58 y.o. male on heparin for r/o ACS. Heparin level 0.15 (subtherapeutic) on 1300 units/hr. Noted plan to cath tomorrow. No bleeding noted. CBC stable at baseline.  Goal of Therapy:  Heparin level 0.3-0.7 units/ml Monitor platelets by anticoagulation protocol: Yes   Plan:  1. Heparin IV bolus 2500 units 2. Increase Heparin IV gtt to 1600 units/hr 3. Daily heparin level and CBC  Christoper Fabianaron Amend, PharmD, BCPS Clinical pharmacist, pager [phone] 12/30/2013,10:18 PM

## 2013-12-30 NOTE — ED Notes (Signed)
i-stat trop. result given to Dr. Shara BlazingNavavati

## 2013-12-31 ENCOUNTER — Other Ambulatory Visit: Payer: Self-pay

## 2013-12-31 ENCOUNTER — Encounter (HOSPITAL_COMMUNITY)
Admission: EM | Disposition: A | Discharge status: Home / Self Care | Payer: BC Managed Care – PPO | Source: Home / Self Care | Attending: Cardiology

## 2013-12-31 HISTORY — PX: LEFT HEART CATHETERIZATION WITH CORONARY ANGIOGRAM: SHX5451

## 2013-12-31 LAB — CBC
HCT: 44.7 % (ref 39.0–52.0)
HEMOGLOBIN: 14.6 g/dL (ref 13.0–17.0)
MCH: 31.3 pg (ref 26.0–34.0)
MCHC: 32.7 g/dL (ref 30.0–36.0)
MCV: 95.7 fL (ref 78.0–100.0)
PLATELETS: 277 10*3/uL (ref 150–400)
RBC: 4.67 MIL/uL (ref 4.22–5.81)
RDW: 13.2 % (ref 11.5–15.5)
WBC: 9.3 10*3/uL (ref 4.0–10.5)

## 2013-12-31 LAB — HEPARIN LEVEL (UNFRACTIONATED): HEPARIN UNFRACTIONATED: 0.56 [IU]/mL (ref 0.30–0.70)

## 2013-12-31 LAB — HEMOGLOBIN A1C
HEMOGLOBIN A1C: 6.6 % — AB (ref <5.7)
Mean Plasma Glucose: 143 mg/dL — ABNORMAL HIGH (ref <117)

## 2013-12-31 LAB — TROPONIN I: Troponin I: <0.3 ng/mL (ref <0.30)

## 2013-12-31 LAB — POCT ACTIVATED CLOTTING TIME: ACTIVATED CLOTTING TIME: 388 s

## 2013-12-31 SURGERY — LEFT HEART CATHETERIZATION WITH CORONARY ANGIOGRAM
Anesthesia: LOCAL

## 2013-12-31 MED ORDER — NITROGLYCERIN IN D5W 200-5 MCG/ML-% IV SOLN
INTRAVENOUS | Status: AC
  Filled 2013-12-31: qty 250

## 2013-12-31 MED ORDER — ACETAMINOPHEN 325 MG PO TABS: 650.0000 mg | ORAL_TABLET | ORAL | Status: DC | PRN

## 2013-12-31 MED ORDER — OXYCODONE-ACETAMINOPHEN 5-325 MG PO TABS
1.0000 | ORAL_TABLET | ORAL | Status: DC | PRN
  Administered 2013-12-31: 1 via ORAL
  Filled 2013-12-31 (×2): qty 1

## 2013-12-31 MED ORDER — HEPARIN (PORCINE) IN NACL 2-0.9 UNIT/ML-% IJ SOLN
INTRAMUSCULAR | Status: AC
  Filled 2013-12-31: qty 1000

## 2013-12-31 MED ORDER — LIDOCAINE HCL (PF) 1 % IJ SOLN
INTRAMUSCULAR | Status: AC
  Filled 2013-12-31: qty 30

## 2013-12-31 MED ORDER — NITROGLYCERIN IN D5W 200-5 MCG/ML-% IV SOLN
5.0000 ug/min | INTRAVENOUS | Status: DC
  Administered 2013-12-31: 5 ug/min via INTRAVENOUS

## 2013-12-31 MED ORDER — PRASUGREL HCL 10 MG PO TABS
10.0000 mg | ORAL_TABLET | Freq: Every day | ORAL | Status: DC
  Filled 2013-12-31: qty 1

## 2013-12-31 MED ORDER — PRASUGREL HCL 10 MG PO TABS
ORAL_TABLET | ORAL | Status: AC
  Filled 2013-12-31: qty 1

## 2013-12-31 MED ORDER — BIVALIRUDIN 250 MG IV SOLR
INTRAVENOUS | Status: AC
  Filled 2013-12-31: qty 250

## 2013-12-31 MED ORDER — SODIUM CHLORIDE 0.9 % IV SOLN: INTRAVENOUS | Status: AC

## 2013-12-31 MED ORDER — ONDANSETRON HCL 4 MG/2ML IJ SOLN: 4.0000 mg | Freq: Four times a day (QID) | INTRAMUSCULAR | Status: DC | PRN

## 2013-12-31 MED ORDER — MIDAZOLAM HCL 2 MG/2ML IJ SOLN
INTRAMUSCULAR | Status: AC
Start: 2013-12-31 — End: 2013-12-31
  Filled 2013-12-31: qty 2

## 2013-12-31 MED ORDER — SODIUM CHLORIDE 0.9 % IV SOLN
1.7500 mg/kg/h | INTRAVENOUS | Status: DC
  Filled 2013-12-31: qty 250

## 2013-12-31 MED ORDER — NITROGLYCERIN 0.2 MG/ML ON CALL CATH LAB
INTRAVENOUS | Status: AC
  Filled 2013-12-31: qty 1

## 2013-12-31 MED ORDER — FENTANYL CITRATE 0.05 MG/ML IJ SOLN
INTRAMUSCULAR | Status: AC
  Filled 2013-12-31: qty 2

## 2013-12-31 MED ORDER — ASPIRIN 81 MG PO CHEW
81.0000 mg | CHEWABLE_TABLET | Freq: Every day | ORAL | Status: DC
  Administered 2014-01-01: 81 mg via ORAL
  Filled 2013-12-31: qty 1

## 2013-12-31 NOTE — CV Procedure (Signed)
Cardiac cath/PTCA stenting report dictated on 12/31/2013 dictation number is [id number]

## 2013-12-31 NOTE — Progress Notes (Signed)
Site area: right groin  Site Prior to Removal:  Level 0  Pressure Applied For 20 MINUTES    Minutes Beginning at 1440  Manual:   Yes.    Patient Status During Pull:  AAO X 3  Post Pull Groin Site:  Level 0  Post Pull Instructions Given:  Yes.    Post Pull Pulses Present:  Yes.    Dressing Applied:  Yes.    Comments:  TOLERATED PROCEDURE WELL

## 2013-12-31 NOTE — Progress Notes (Signed)
ANTICOAGULATION CONSULT NOTE - Follow Up Consult  Pharmacy Consult for Heparin  Indication: chest pain/ACS  Allergies  Allergen Reactions  . Ceftin [Cefuroxime Axetil] Rash    Shortness of breath    Patient Measurements: Height: 5\' 8"  (172.7 cm) Weight: 239 lb 3.2 oz (108.5 kg) IBW/kg (Calculated) : 68.4 Heparin Dosing Weight: 90 kg  Vital Signs: Temp: 97.8 F (36.6 C) (06/30 0417) Temp src: Oral (06/30 0417) BP: 109/64 mmHg (06/30 0000) Pulse Rate: 55 (06/30 0000)  Labs:  Recent Labs  12/30/13 1514 12/30/13 2130 12/30/13 2306 12/31/13 0305  HGB 15.3 14.8  --  14.6  HCT 45.9 43.6  --  44.7  PLT 282 279  --  277  APTT  --  47*  --   --   LABPROT  --  13.4  --   --   INR  --  1.02  --   --   HEPARINUNFRC  --  0.15*  --  0.56  CREATININE 0.77 0.80  --   --   TROPONINI <0.30  --  <0.30  --     Estimated Creatinine Clearance: 121.6 ml/min (by C-G formula based on Cr of 0.8).   Medications: Heparin 1600 units/hr  Assessment: 58 y/o M on heparin for r/o ACS. HL is 0.56. Other labs as above.   Goal of Therapy:  Heparin level 0.3-0.7 units/ml Monitor platelets by anticoagulation protocol: Yes   Plan:  -Continue heparin at 1600 units/hr -1200 HL -Daily CBC/HL -Monitor for bleeding  Abran DukeLedford, James 12/31/2013,4:54 AM

## 2013-12-31 NOTE — Interval H&P Note (Signed)
Cath Lab Visit (complete for each Cath Lab visit)  Clinical Evaluation Leading to the Procedure:   ACS: Yes.    Non-ACS:    Anginal Classification: CCS IV  Anti-ischemic medical therapy: Maximal Therapy (2 or more classes of medications)  Non-Invasive Test Results: No non-invasive testing performed  Prior CABG: No previous CABG      History and Physical Interval Note:  12/31/2013 8:50 AM  [name] [name]  has presented today for surgery, with the diagnosis of cp  The various methods of treatment have been discussed with the patient and family. After consideration of risks, benefits and other options for treatment, the patient has consented to  Procedure(s): LEFT HEART CATHETERIZATION WITH CORONARY ANGIOGRAM (N/A) as a surgical intervention .  The patient's history has been reviewed, patient examined, no change in status, stable for surgery.  I have reviewed the patient's chart and labs.  Questions were answered to the patient's satisfaction.     Robynn PaneHARWANI,MOHAN N

## 2013-12-31 NOTE — Progress Notes (Signed)
UR completed Crystal K. Hutchinson, RN, BSN, MSHL, CCM  12/31/2013 11:29 AM

## 2014-01-01 LAB — CBC
HCT: 43.7 % (ref 39.0–52.0)
Hemoglobin: 14.9 g/dL (ref 13.0–17.0)
MCH: 32.3 pg (ref 26.0–34.0)
MCHC: 34.1 g/dL (ref 30.0–36.0)
MCV: 94.6 fL (ref 78.0–100.0)
PLATELETS: 265 10*3/uL (ref 150–400)
RBC: 4.62 MIL/uL (ref 4.22–5.81)
RDW: 13.2 % (ref 11.5–15.5)
WBC: 9.5 10*3/uL (ref 4.0–10.5)

## 2014-01-01 LAB — BASIC METABOLIC PANEL
BUN: 9 mg/dL (ref 6–23)
CO2: 26 mEq/L (ref 19–32)
CREATININE: 0.73 mg/dL (ref 0.50–1.35)
Calcium: 8.9 mg/dL (ref 8.4–10.5)
Chloride: 100 mEq/L (ref 96–112)
GFR calc non Af Amer: >90 mL/min (ref >90)
Glucose, Bld: 108 mg/dL — ABNORMAL HIGH (ref 70–99)
Potassium: 4.5 mEq/L (ref 3.7–5.3)
Sodium: 137 mEq/L (ref 137–147)

## 2014-01-01 MED ORDER — ASPIRIN 81 MG PO TBEC: 81.0000 mg | DELAYED_RELEASE_TABLET | Freq: Every day | ORAL | Status: AC

## 2014-01-01 MED ORDER — RAMIPRIL 2.5 MG PO CAPS: 2.5000 mg | ORAL_CAPSULE | Freq: Every day | ORAL | Status: AC

## 2014-01-01 MED FILL — Sodium Chloride IV Soln 0.9%: INTRAVENOUS | Qty: 50 | Status: AC

## 2014-01-01 NOTE — Discharge Instructions (Signed)
Acute Coronary Syndrome °Acute coronary syndrome (ACS) is an urgent problem in which the blood and oxygen supply to the heart is critically deficient. ACS requires hospitalization because one or more coronary arteries may be blocked. °ACS represents a range of conditions including: °· Previous angina that is now unstable, lasts longer, happens at rest, or is more intense. °· A heart attack, with heart muscle cell injury and death. °There are three vital coronary arteries that supply the heart muscle with blood and oxygen so that it can pump blood effectively. If blockages to these arteries develop, blood flow to the heart muscle is reduced. If the heart does not get enough blood, angina may occur as the first warning sign. °SYMPTOMS  °· The most common signs of angina include: °¨ Tightness or squeezing in the chest. °¨ Feeling of heaviness on the chest. °¨ Discomfort in the arms, neck, back, or jaw. °¨ Shortness of breath and nausea. °¨ Cold, wet skin. °· Angina is usually brought on by physical effort or excitement which increase the oxygen needs of the heart. These states increase the blood flow needs of the heart beyond what can be delivered. °· Other symptoms that are not as common include: °¨ Fatigue °¨ Unexplained feelings of nervousness or anxiety °¨ Weakness °¨ Diarrhea °· Sometimes, you may not have noticed any symptoms at all but still suffered a cardiac injury. °TREATMENT  °· Medicines to help discomfort may include nitroglycerin (nitro) in the form of tablets or a spray for rapid relief, or longer-acting forms such as cream, patches, or capsules. (Be aware that there are many side effects and possible interactions with other drugs). °· Other medicines may be used to help the heart pump better. °· Procedures to open blocked arteries including angioplasty or stent placement to keep the arteries open. °· Open heart surgery may be needed when there are many blockages or they are in critical locations that  are best treated with surgery. °HOME CARE INSTRUCTIONS  °· Do not use any tobacco products including cigarettes, chewing tobacco, or electronic cigarettes. °· Take one baby or adult aspirin daily, if your health care provider advises. This helps reduce the risk of a heart attack. °· It is very important that you follow the angina treatment prescribed by your health care provider. Make arrangements for proper follow-up care. °· Eat a heart healthy diet with salt and fat restrictions as advised. °· Regular exercise is good for you as long as it does not cause discomfort. Do not begin any new type of exercise until you check with your health care provider. °· If you are overweight, you should lose weight. °· Try to maintain normal blood lipid levels. °· Keep your blood pressure under control as recommended by your health care provider. °· You should tell your health care provider right away about any increase in the severity or frequency of your chest discomfort or angina attacks. When you have angina, you should stop what you are doing and sit down. This may bring relief in 3 to 5 minutes. If your health care provider has prescribed nitro, take it as directed. °· If your health care provider has given you a follow-up appointment, it is very important to keep that appointment. Not keeping the appointment could result in a chronic or permanent injury, pain, and disability. If there is any problem keeping the appointment, you must call back to this facility for assistance. °SEEK IMMEDIATE MEDICAL CARE IF:  °· You develop nausea, vomiting, or shortness   of breath. °· You feel faint, lightheaded, or pass out. °· Your chest discomfort gets worse. °· You are sweating or experience sudden profound fatigue. °· You do not get relief of your chest pain after 3 doses of nitro. °· Your discomfort lasts longer than 15 minutes. °MAKE SURE YOU:  °· Understand these instructions. °· Will watch your condition. °· Will get help right  away if you are not doing well or get worse. °· Take all medicines as directed by your health care provider. °Document Released: 06/20/2005 Document Revised: 06/25/2013 Document Reviewed: 01/22/2008 °ExitCare® Patient Information ©2015 [employer]. This information is not intended to replace advice given to you by your health care provider. Make sure you discuss any questions you have with your health care provider. °Coronary Angiogram with Stent °Coronary angiography with stent placement is a procedure to widen or open a narrow blood vessel of the heart (coronary artery). When a coronary artery becomes partially blocked, it decreases blood flow to that area. This may lead to chest pain or a heart attack (myocardial infarction). Arteries may become blocked by cholesterol buildup (plaque) in the lining or wall.  °A stent is a small piece of metal that looks like a mesh or a spring. Stent placement may be done right after a coronary angiography in which a blocked artery is found or as a treatment for a heart attack.  °LET YOUR HEALTH CARE PROVIDER KNOW ABOUT: °· Any allergies you have.   °· All medicines you are taking, including vitamins, herbs, eye drops, creams, and over-the-counter medicines.   °· Previous problems you or members of your family have had with the use of anesthetics.   °· Any blood disorders you have.   °· Previous surgeries you have had.   °· Medical conditions you have. °RISKS AND COMPLICATIONS °Generally, coronary angiography with stent is a safe procedure. However, as with any procedure, complications can occur. Possible complications include:  °· Damage to the heart or its blood vessels.   °· A return of blockage.   °· Bleeding, infection, or bruising at the insertion site.   °· A collection of blood under the skin (hematoma) at the insertion site. °· Blood clot in another part of the body.   °· Kidney injury.   °· Allergic reaction to the dye or contrast used.   °· Bleeding into the abdomen  (retroperitoneal bleeding) °BEFORE THE PROCEDURE °· Do not eat or drink anything for 6 hours before the procedure.   °· Ask your health care provider about changing or stopping your regular medicines. This is especially important if you are taking diabetes medicines or blood thinners. °· Your health care provider will make sure you understand the procedure and the risks and potential complications associated with the procedure.   °PROCEDURE °· You may be given a medicine to help you relax before and during the procedure (sedative). This medicine will be given through an IV tube that is put into one of your veins.   °· The area where the catheter will be inserted is shaved and cleaned. This is usually done in the groin but may be done in the fold of your arm (near your elbow) or in the wrist.    °· A medicine will be given to numb the area where the catheter will be inserted (local anesthetic).   °· The catheter is inserted into an artery using a guide wire. A type of X-ray (fluoroscopy) is used to help guide the catheter to the opening of the blocked artery.   °· A dye is then injected into the catheter, and   X-rays are taken. The dye helps to show where any narrowing or blockages are located in the heart arteries.   °· A tiny wire is guided to the blocked spot, and a balloon is inflated to make the artery wider. The stent is expanded and crushes the plaque into the wall of the vessel. The stent holds the area open like a scaffolding and improves the blood flow.   °· Sometimes the artery may be made wider using a laser or other tools to remove plaque.   °· When the blood flow is better, the catheter is removed. The lining of the artery will grow over the stent, which stays where it was placed.   °AFTER THE PROCEDURE °· If the procedure is done through the leg, you will be kept in bed lying flat for about 6 hours. You will be instructed to not bend or cross your legs.   °· The insertion site will be checked  frequently.   °· The pulse in your feet or wrist will be checked frequently.   °· Additional blood tests, X-rays, and electrocardiography may be done. °Document Released: 12/25/2002 Document Revised: 06/25/2013 Document Reviewed: 12/27/2012 °ExitCare® Patient Information ©2015 [employer]. This information is not intended to replace advice given to you by your health care provider. Make sure you discuss any questions you have with your health care provider. ° °

## 2014-01-01 NOTE — Care Management Note (Addendum)
  Page 1 of 1   01/01/2014     1:37:43 PM CARE MANAGEMENT NOTE 01/01/2014  Patient:  [name] [name]   Account Number:  [id number]  Date Initiated:  01/01/2014  Documentation initiated by:  Donato SchultzHUTCHINSON,Analaura Messler  Subjective/Objective Assessment:   Chest Pain     Action/Plan:   CM to follow for dispositon needs   Anticipated DC Date:  01/01/2014   Anticipated DC Plan:  HOME/SELF CARE      DC Planning Services  CM consult  Medication Assistance      Choice offered to / List presented to:             Status of service:  Completed, signed off Medicare Important Message given?   (If response is "NO", the following Medicare IM given date fields will be blank) Date Medicare IM given:   Medicare IM given by:   Date Additional Medicare IM given:   Additional Medicare IM given by:    Discharge Disposition:  HOME/SELF CARE  Per UR Regulation:  Reviewed for med. necessity/level of care/duration of stay  If discussed at Long Length of Stay Meetings, dates discussed:    Comments:  Damacio Weisgerber RN, BSN, MSHL, CCM  Nurse - Case Manager, (Unit [phone])  [phone]  01/01/2014 Benefits check Effient 10mg  qd coverage, co-pays, deductibles, authorizations and preferred pharm Per CMA update:  ---01/01/2014 1047 by NIA SHEALY---PT COPAY WILL BE $50 PLUS 25 %COINSURNCE-GENERIC COPAY $10- NO PRIOR AUTH REQUIRED  Patient active with Effient [employer]; no other needs identified at this time. Dispostion Plan:  Home / Self care.

## 2014-01-01 NOTE — Discharge Summary (Signed)
NAME:  [name] [name], [name] [name]             ACCOUNT NO.:  [id number]  MEDICAL RECORD NO.:  [id number]  LOCATION:  6C07C                        FACILITY:  [facility]  PHYSICIAN:  [name] [name], M.D. DATE OF BIRTH:  [birth year]  DATE OF ADMISSION:  12/30/2013 DATE OF DISCHARGE:  01/01/2014                              DISCHARGE SUMMARY   ADMITTING DIAGNOSES: 1. Acute coronary syndrome. 2. Coronary artery disease, history of inferior wall myocardial     infarction in the past status post PCI to distal RCA. 3. History of silent anteroseptal wall myocardial infarction in the     past status post attempted PCI to the distal 100% occluded in the     past, 100% occluded LAD in the past. 4. Hypertension. 5. Glucose intolerance. 6. Hypercholesteremia. 7. Tobacco abuse. 8. Erectile dysfunction. 9. History of nonsustained ventricular tachycardia. 10.Family history of coronary artery disease.  DISCHARGE DIAGNOSES: 1. Status post acute coronary syndrome, status post left cardiac     cath/PTCA stenting to distal RCA with excellent results. 2. Coronary artery disease, history of inferior wall myocardial     infarction in the past, status post PCI to RCA in the past. 3 .  History of silent anteroseptal wall myocardial infarction in the past, status post PTCA stenting to ostial LAD and attempted PCI to distal 100% occluded LAD in the past. 1. Hypertension. 2. Hypercholesterolemia. 3. Glucose intolerance. 4. History of tobacco abuse. 5. Erectile dysfunction. 6. History of nonsustained ventricular tachycardia in the past. 7. Positive family history of coronary artery disease.  DISCHARGE HOME MEDICATIONS:  Ramipril 2.5 mg 1 capsule daily, aspirin 81 mg 1 tablet daily, Effient 10 mg 1 tablet daily, atorvastatin 80 mg 1 tablet daily, metoprolol tartrate 25 mg twice daily, Nitrostat 0.4 mg sublingual use as directed.  Pepcid 20 mg over-the-counter medication as needed.  DIET:  Low salt, low cholesterol  1800 calories ADA diet.  The patient has been advised to stop smoking.  The patient will be scheduled for phase 2 cardiac rehab as outpatient post cardiac cath/PTCA stent instructions have been given.  CONDITION AT DISCHARGE:  Stable.  BRIEF HISTORY AND HOSPITAL COURSE:  [name] [name] is a 58 year old male with past medical history significant for coronary artery disease, history of silent anteroseptal wall MI in the past, subsequently also had inferior wall MI, status post PCI to RCA and attempted PCI to 100% occluded LAD in the past, hypertension, hypercholesteremia, glucose intolerance, history of tobacco abuse, history of nonsustained ventricular tachycardia in the past, positive family history of coronary artery disease, history of erectile dysfunction who was transferred for urgent care to [facility].  The patient complained of retrosternal burning chest pain, which started around 2 a.m., radiating to the jaw associated with diaphoresis off and on.  He went to sleep, woke up again at 9 a.m. with chest pain, grade 6 or 10 associated with mild diaphoresis.  The patient took aspirin with partial relief and was started on heparin and IV nitrates with relief of chest pain in the ED. The patient was noted to have minimally elevated troponin-I point of care marker.  The patient denies any shortness of breath.  Denies palpitation, lightheadedness, or  syncope.  Denies such episodes of chest pain in recent past.  The patient states chest pain feels similar in nature when he had MI in the past.  Denies chest pain at present.  When seen in the ED, the patient denies any rest or nocturnal angina.  EKG done in the ER showed no significant acute ischemic changes.  PAST MEDICAL HISTORY:  As above.  PHYSICAL EXAMINATION:  GENERAL:  He was alert, awake, oriented x3. VITAL SIGNS:  Blood pressure was 101/64, pulse was 52, he was afebrile. HEENT:  Conjunctivae was pink. NECK:  Supple.   No JVD.  No bruit. LUNGS:  Clear to auscultation without rhonchi or rales. CARDIOVASCULAR:  S1, S2 was normal.  There was soft systolic murmur and S4 gallop. ABDOMEN:  Soft.  Bowel sounds were present.  Nontender. EXTREMITIES:  There is no clubbing, cyanosis, or edema.  LABORATORY DATA:  Sodium was 139, potassium 4.2, BUN 12, creatinine 0.77.  His blood sugar was 92, repeat blood sugar yesterday was 168. This morning, fasting blood sugar is 108.  His hemoglobin was 15.3, hematocrit 45.9, white count of 8.9.  Troponin I point of care was 0.10 which was slightly elevated.  By lab, 3 sets of troponin-I were less than 0.30 which were in normal range.  This morning, hemoglobin is 14.9, hematocrit 43.7, white count of 9.5.  His BUN is 9, creatinine 0.73. His hemoglobin A1c was 6.6.  TSH was normal 1.88.  EKG this morning showed sinus bradycardia, no acute ischemic changes.  BRIEF HOSPITAL COURSE:  The patient was admitted to step-down unit.  MI was ruled out by serial enzymes and EKG.  The patient subsequently underwent left cardiac cath with selective left and right coronary angiography, LV graphy as per procedure report.  The patient tolerated procedure well.  There were no complications.  Postprocedure, the patient did not have any episodes of chest pain during the hospital stay.  His groin is stable with no evidence of hematoma or bruit.  The patient is ambulating in hallway without any problems.  Phase 1 cardiac rehab was called.  The patient is scheduled for phase 2 cardiac rehab as outpatient.  The patient will be discharged home on above medications and will be followed up in my office in 1 week.  The patient has been discussed at length regarding smoking cessation, exercise, lifestyle modification, etc.     [name] [name], M.D.     MNH/MEDQ  D:  01/01/2014  T:  01/01/2014  Job:  [id number]

## 2014-01-01 NOTE — Discharge Summary (Signed)
  Discharge summary dictated on 01/01/2014 dictation number is [id number]

## 2014-01-01 NOTE — Cardiovascular Report (Signed)
NAME:  [name] [name], [name] [name]             ACCOUNT NO.:  [id number]  MEDICAL RECORD NO.:  [id number]  LOCATION:  6C07C                        FACILITY:  [facility]  PHYSICIAN:  Ramil Edgington N. Sharyn LullHarwani, M.D. DATE OF BIRTH:  11-15-1955  DATE OF PROCEDURE:  12/31/2013 DATE OF DISCHARGE:                           CARDIAC CATHETERIZATION   PROCEDURES: 1. Left cardiac cath with selective left and right coronary     angiography, left ventriculography via right groin using Judkins     technique. 2. Successful percutaneous transluminal coronary angioplasty to distal     right coronary artery, initially using 1.5 x 15-mm long Mini-Trek     balloon and then 2.0 x 12-mm long Mini-Trek balloon for     predilatation. 3. Successful deployment of 2.5 x 18-mm long Xience Alpine drug-     eluting stent in distal right coronary artery.  INDICATION FOR THE PROCEDURE:  Mr. [name] [name] is 58 year old male with past medical history significant for silent anteroseptal wall MI, subsequently also had inferior wall MI, status post PCI to PDA in the past and attempted PCI to 100% occluded distal LAD in the past, hypertension, hypercholesteremia, glucose intolerance, history of tobacco abuse, history of nonsustained ventricular tachycardia in the past, positive family history of coronary artery disease, history of erectile dysfunction, who was transferred from urgent care to [facility].  The patient complained of retrosternal burning chest pain, which started around 2 a.m., radiating to the jaw associated with diaphoresis, off and on.  Went to sleep, woke up again at 9 a.m. with chest pain, grade 6/10, associated with mild diaphoresis.  The patient took aspirin with partial relief and was started on heparin and nitro with relief of chest pain in ED.  The patient was noted to have minimally-elevated troponin-I point of care marker.  The patient denies any shortness of breath.  Denies palpitation, lightheadedness,  or syncope.  Denies such episodes of chest pain in recent past.  States chest pain feels similar in nature when he had MI in the past.  Denies any chest pain at present.  Denies history of recent rest or nocturnal angina.  The patient was admitted to Step-Down unit.  The patient ruled out for acute MI.  His two sets subsequent troponin-I were negative. EKG showed no acute ischemic changes.  Due to typical anginal chest pain and multiple risk factors, discussed with the patient at length regarding left cath, possible PTCA and stenting, its risks and benefits, i.e., death, MI, stroke, need for emergency CABG, local vascular complications, risk of restenosis, etc. and consented for the procedure.  DESCRIPTION OF PROCEDURE:  After obtaining the informed consent, the patient was brought to the Cath Lab and was placed on fluoroscopy table. Right groin was prepped and draped in usual fashion.  Xylocaine 1% was used for local anesthesia in the right groin.  With the help of thin wall needle, 5-French arterial sheath was placed.  The sheath was aspirated and flushed.  Next, 5-French left Judkins catheter was advanced over the wire under fluoroscopic guidance up to the ascending aorta.  Wire was pulled out, the catheter was aspirated and connected to the Manifold.  Catheter was further advanced and engaged into  left coronary ostium.  Multiple views of the left system were taken.  Next, catheter was disengaged and was pulled out over the wire and was replaced with 5-French right Judkins catheter, which was advanced over the wire under fluoroscopic guidance up to the ascending aorta.  Wire was pulled out, the catheter was aspirated and connected to the Manifold.  Catheter was further advanced and engaged into right coronary ostium.  Multiple views of the right system were taken.  Next, catheter was disengaged and was pulled out over the wire and was replaced with 5- French pigtail catheter, which  was advanced over the wire under fluoroscopic guidance up to the ascending aorta.  Wire was pulled out, the catheter was aspirated and connected to the Manifold.  Catheter was further advanced across the aortic valve into the LV.  LV pressures were recorded.  Next, left ventriculography was done in 30-degree RAO position.  Post-angiographic pressures were recorded from LV and then pullback pressures were recorded from the aorta.  There was no gradient across the aortic valve.  Next, the pigtail catheter was pulled out over the wire.  Sheaths were aspirated and flushed.  FINDINGS:  LV showed mild global hypokinesia, EF of 45%-50%.  Left main was patent.  LAD has patent ostial stent and then 40-50% mid-diffuse stenosis and then 100% occluded as before.  Diagonal 1 was large, which has 30-40% proximal and mid-stenosis, left circumflex has 20-25% proximal and mid-stenosis and 70-75% distal bifurcation stenosis. Distally, the vessel is very small.  OM1 is large, which has 30-40% proximal stenosis.  OM2 and 3 were very small.  RCA has 30-40% ostial and 10-15% mid-stenosis and then 90-95% distal sequential stenosis with haziness, which is the culprit lesion.  PDA and PLV branches were very small.  Acute marginal had 50-60% ostial and 70-80% mid-stenosis, which is supplying collaterals to distal LAD.  INTERVENTIONAL PROCEDURE:  Successful PTCA to distal RCA was done initially using 1.5 x 15-mm long Mini-Trek balloon for predilatation using double wire technique and then 2.0 x 12-mm long Mini-Trek balloon for predilatation and then 2.5 x 18-mm long Xience Alpine drug-eluting stent was deployed at 11 atmospheric pressure.  The stent was postdilated using same balloon going up to 13 atmospheric pressure. Lesion dilated from 90-95% to 0% residual with excellent TIMI grade 3 distal flow without evidence of dissection or distal embolization.  The patient received weight-based Angiomax and 30 mg of  prasugrel as the patient was already on prasugrel at home prior to the procedure.  The patient tolerated the procedure well.  There were no complications.  The patient was transferred to recovery room in stable condition.     [name] [name], M.D.     MNH/MEDQ  D:  12/31/2013  T:  01/01/2014  Job:  [id number]

## 2014-01-01 NOTE — Progress Notes (Signed)
CARDIAC REHAB PHASE I   PRE:  Rate/Rhythm: 85 SR  BP:  Supine:   Sitting: 156/90  Standing:    SaO2:   MODE:  Ambulation: 800 ft   POST:  Rate/Rhythm: 94 SR  BP:  Supine:   Sitting: 156/82  Standing:    SaO2:  0810-0915 Pt walked 800 ft with steady gait. No CP. Tolerated well. Education completed. Discussed smoking cessation and gave handouts. Pt stated he did hypnosis before which worked well. Going to try cold [place] now. Discussed with pt that HGA1C at 6.6 and he needed to watch carbs and begin regular exercise. Gave carb counting and heart healthy diets. Discussed CRP 2 and highly recommended to pt to help with weight loss and diet. Pt considering and agreed to referral to [place]. Has been on effient already and knows to continue.   Luetta Nuttingharlene Nautia Lem, RN BSN  01/01/2014 9:12 AM

## 2014-01-01 NOTE — Progress Notes (Signed)
Subjective:  Patient denies any chest pain or shortness of breath. States feels better after PCI  Objective:  Vital Signs in the last 24 hours: Temp:  [98 F (36.7 C)-99.1 F (37.3 C)] 98 F (36.7 C) (07/01 0810) Pulse Rate:  [10-78] 78 (07/01 0810) Resp:  [15-20] 20 (07/01 0810) BP: (99-156)/(53-90) 156/90 mmHg (07/01 0810) SpO2:  [93 %-95 %] 93 % (07/01 0810) Weight:  [111.2 kg (245 lb 2.4 oz)] 111.2 kg (245 lb 2.4 oz) (07/01 0001)  Intake/Output from previous day: 06/30 0701 - 07/01 0700 In: 1690.9 [P.O.:120; I.V.:1570.9] Out: 3350 [Urine:3350] Intake/Output from this shift:    Physical Exam: Neck: no adenopathy, no carotid bruit, no JVD and supple, symmetrical, trachea midline Lungs: clear to auscultation bilaterally Heart: regular rate and rhythm, S1, S2 normal, no murmur, click, rub or gallop Abdomen: soft, non-tender; bowel sounds normal; no masses,  no organomegaly Extremities: extremities normal, atraumatic, no cyanosis or edema right groin stable  Lab Results:  Recent Labs  12/31/13 0305 01/01/14 0532  WBC 9.3 9.5  HGB 14.6 14.9  PLT 277 265    Recent Labs  12/30/13 2130 01/01/14 0532  NA 139 137  K 3.7 4.5  CL 102 100  CO2 28 26  GLUCOSE 168* 108*  BUN 13 9  CREATININE 0.80 0.73    Recent Labs  12/30/13 2306 12/31/13 0305  TROPONINI <0.30 <0.30   Hepatic Function Panel  Recent Labs  12/30/13 2130  PROT 6.3  ALBUMIN 3.3*  AST 20  ALT 22  ALKPHOS 50  BILITOT 0.3   No results found for this basename: CHOL,  in the last 72 hours No results found for this basename: PROTIME,  in the last 72 hours  Imaging: Imaging results have been reviewed and No results found.  Cardiac Studies:  Assessment/Plan:  Acute coronary syndrome status post left cardiac cath/PTCA stenting to distal RCA with excellent results Coronary artery disease history of inferior wall MI status post PCI to RCA in the past  History of silent anteroseptal MI status  post attempted PCI to LAD in the past  Hypertension  Glucose intolerance  Hypercholesteremia  History of tobacco abuse  Erectile dysfunction  History of nonsustained VT in the past  Also family history of coronary artery disease  Plan Discussed with patient regarding diet exercise smoking cessation completely phase II cardiac rehabilitation etc. DC home followup with me in one week Post cardiac cath/PTCA stent instructions given  LOS: 2 days    Melrose Kearse N 01/01/2014, 8:55 AM

## 2014-04-16 ENCOUNTER — Telehealth (HOSPITAL_COMMUNITY): Payer: Self-pay | Admitting: Cardiac Rehabilitation

## 2014-04-16 NOTE — Telephone Encounter (Signed)
Multiple attempts to contact pt to enroll in cardiac rehab. Pt has not returned calls or responded to letter mailed to home address.  Dr. Sharyn LullHarwani made aware

## 2014-06-12 ENCOUNTER — Encounter (HOSPITAL_COMMUNITY): Payer: Self-pay | Admitting: Cardiology

## 2015-10-28 DIAGNOSIS — E7800 Pure hypercholesterolemia, unspecified: Secondary | ICD-10-CM | POA: Diagnosis not present

## 2015-10-28 DIAGNOSIS — I10 Essential (primary) hypertension: Secondary | ICD-10-CM | POA: Diagnosis not present

## 2015-10-28 DIAGNOSIS — I2510 Atherosclerotic heart disease of native coronary artery without angina pectoris: Secondary | ICD-10-CM | POA: Diagnosis not present

## 2015-10-28 DIAGNOSIS — R7309 Other abnormal glucose: Secondary | ICD-10-CM | POA: Diagnosis not present

## 2015-11-04 DIAGNOSIS — I10 Essential (primary) hypertension: Secondary | ICD-10-CM | POA: Diagnosis not present

## 2015-11-04 DIAGNOSIS — I252 Old myocardial infarction: Secondary | ICD-10-CM | POA: Diagnosis not present

## 2015-11-04 DIAGNOSIS — I2510 Atherosclerotic heart disease of native coronary artery without angina pectoris: Secondary | ICD-10-CM | POA: Diagnosis not present

## 2015-11-04 DIAGNOSIS — E785 Hyperlipidemia, unspecified: Secondary | ICD-10-CM | POA: Diagnosis not present

## 2016-02-11 DIAGNOSIS — I252 Old myocardial infarction: Secondary | ICD-10-CM | POA: Diagnosis not present

## 2016-02-11 DIAGNOSIS — E785 Hyperlipidemia, unspecified: Secondary | ICD-10-CM | POA: Diagnosis not present

## 2016-02-11 DIAGNOSIS — I10 Essential (primary) hypertension: Secondary | ICD-10-CM | POA: Diagnosis not present

## 2016-02-11 DIAGNOSIS — I2510 Atherosclerotic heart disease of native coronary artery without angina pectoris: Secondary | ICD-10-CM | POA: Diagnosis not present

## 2016-03-30 DIAGNOSIS — Z23 Encounter for immunization: Secondary | ICD-10-CM | POA: Diagnosis not present

## 2016-06-16 DIAGNOSIS — E785 Hyperlipidemia, unspecified: Secondary | ICD-10-CM | POA: Diagnosis not present

## 2016-06-16 DIAGNOSIS — I10 Essential (primary) hypertension: Secondary | ICD-10-CM | POA: Diagnosis not present

## 2016-06-16 DIAGNOSIS — I2510 Atherosclerotic heart disease of native coronary artery without angina pectoris: Secondary | ICD-10-CM | POA: Diagnosis not present

## 2016-06-16 DIAGNOSIS — R7309 Other abnormal glucose: Secondary | ICD-10-CM | POA: Diagnosis not present

## 2016-09-02 ENCOUNTER — Ambulatory Visit (INDEPENDENT_AMBULATORY_CARE_PROVIDER_SITE_OTHER): Payer: BLUE CROSS/BLUE SHIELD | Admitting: Family Medicine

## 2016-09-02 VITALS — BP 144/94 | HR 65 | Temp 98.6°F | Resp 17 | Ht 68.5 in | Wt 237.0 lb

## 2016-09-02 DIAGNOSIS — R0981 Nasal congestion: Secondary | ICD-10-CM | POA: Diagnosis not present

## 2016-09-02 DIAGNOSIS — H6121 Impacted cerumen, right ear: Secondary | ICD-10-CM | POA: Diagnosis not present

## 2016-09-02 MED ORDER — FLUTICASONE PROPIONATE 50 MCG/ACT NA SUSP: 2.0000 | Freq: Every day | NASAL | 6 refills | Status: AC

## 2016-09-02 MED ORDER — AZITHROMYCIN 250 MG PO TABS: ORAL_TABLET | ORAL | 0 refills | Status: AC

## 2016-09-02 NOTE — Progress Notes (Signed)
Chief Complaint  Patient presents with  . Other    coughing up blood     HPI   Pt reports that he has been having a cough that is productive and occasionally there is a speck of blood He reports that this has been going on for 3 days He reports that he has postnasal drip from his sinuses which are "messed up due to the bad weather" He reports that he has a little wheezing but has not been having shortness of breath No fevers or chills     Past Medical History:  Diagnosis Date  . Allergy   . Hyperlipidemia   . Hypertension   . Myocardial infarction     Current Outpatient Prescriptions  Medication Sig Dispense Refill  . aspirin EC 81 MG EC tablet Take 1 tablet (81 mg total) by mouth daily. 30 tablet 3  . atorvastatin (LIPITOR) 80 MG tablet Take 80 mg by mouth daily.    . metoprolol tartrate (LOPRESSOR) 25 MG tablet Take 25 mg by mouth 2 (two) times daily.    Marland Kitchen. OVER THE COUNTER MEDICATION Takes Pepcid OTC 20 mg QD    . prasugrel (EFFIENT) 10 MG TABS Take 10 mg by mouth.    . ramipril (ALTACE) 2.5 MG capsule Take 1 capsule (2.5 mg total) by mouth daily. 30 capsule 3  . azithromycin (ZITHROMAX) 250 MG tablet Take 2 tablets on day one and one tablet each day after 6 tablet 0  . fluticasone (FLONASE) 50 MCG/ACT nasal spray Place 2 sprays into both nostrils daily. 16 g 6  . nitroGLYCERIN (NITROSTAT) 0.4 MG SL tablet Place 0.4 mg under the tongue every 5 (five) minutes as needed. Prescription is expired.     No current facility-administered medications for this visit.     Allergies:  Allergies  Allergen Reactions  . Ceftin [Cefuroxime Axetil] Rash    Shortness of breath    Past Surgical History:  Procedure Laterality Date  . CARDIAC SURGERY    . CORONARY ARTERY BYPASS GRAFT    . LEFT HEART CATHETERIZATION WITH CORONARY ANGIOGRAM N/A 12/31/2013   Procedure: LEFT HEART CATHETERIZATION WITH CORONARY ANGIOGRAM;  Surgeon: Robynn PaneMohan N Harwani, MD;  [facility];  Service:  Cardiovascular;  Laterality: N/A;    Social History   Social History  . Marital status: Married    Spouse name: N/A  . Number of children: N/A  . Years of education: N/A   Social History Main Topics  . Smoking status: Current Every Day Smoker    Packs/day: 0.40    Years: 32.00    Types: Cigarettes    Start date: 02/11/1983  . Smokeless tobacco: Never Used  . Alcohol use Yes     Comment: occasional  . Drug use: No  . Sexual activity: No   Other Topics Concern  . None   Social History Narrative  . None    ROS See hpi  Objective: Vitals:   09/02/16 1150  BP: (!) 144/94  Pulse: 65  Resp: 17  Temp: 98.6 F (37 C)  TempSrc: Oral  SpO2: 97%  Weight: 237 lb (107.5 kg)  Height: 5' 8.5" (1.74 m)    Physical Exam  General: alert, oriented, in NAD Head: normocephalic, atraumatic, no sinus tenderness Eyes: EOM intact, no scleral icterus or conjunctival injection Ears: TM impacted bilaterally Throat: no pharyngeal exudate or erythema Lymph: no posterior auricular, submental or cervical lymph adenopathy Heart: normal rate, normal sinus rhythm, no murmurs Lungs: clear  to auscultation bilaterally, no wheezing    Assessment and Plan Devantae was seen today for other.  Diagnoses and all orders for this visit:  Impacted cerumen, right ear- resolved with ear lavage -     Ear wax removal  Sinus congestion-  Discussed with pt that flonase and antihistamine can help his symptoms He should plan to try to increase hydration Gave him information about sinusitis and if his symptoms progress or if he develops fevers to take zpak He verbalized understanding  Other orders -     fluticasone (FLONASE) 50 MCG/ACT nasal spray; Place 2 sprays into both nostrils daily. -     azithromycin (ZITHROMAX) 250 MG tablet; Take 2 tablets on day one and one tablet each day after     Derry Arbogast A Creta Levin

## 2016-09-02 NOTE — Patient Instructions (Addendum)
IF you received an x-ray today, you will receive an invoice from [facility]. Please contact [facility] at [phone] with questions or concerns regarding your invoice.   IF you received labwork today, you will receive an invoice from [facility]. Please contact [facility] at [phone] with questions or concerns regarding your invoice.   Our billing staff will not be able to assist you with questions regarding bills from these companies.  You will be contacted with the lab results as soon as they are available. The fastest way to get your results is to activate your My Chart account. Instructions are located on the last page of this paperwork. If you have not heard from [place] regarding the results in 2 weeks, please contact this office.      Earwax Buildup Your ears make a substance called earwax. It may also be called cerumen. Sometimes, too much earwax builds up in your ear canal. This can cause ear pain and make it harder for you to hear. CAUSES This condition is caused by too much earwax production or buildup. RISK FACTORS The following factors may make you more likely to develop this condition:  Cleaning your ears often with swabs.  Having narrow ear canals.  Having earwax that is overly thick or sticky.  Having eczema.  Being dehydrated. SYMPTOMS Symptoms of this condition include:  Reduced hearing.  Ear drainage.  Ear pain.  Ear itch.  A feeling of fullness in the ear or feeling that the ear is plugged.  Ringing in the ear.  Coughing. DIAGNOSIS Your health care provider can diagnose this condition based on your symptoms and medical history. Your health care provider will also do an ear exam to look inside your ear with a scope (otoscope). You may also have a hearing test. TREATMENT Treatment for this condition includes:  Over-the-counter or prescription ear drops to soften the earwax.  Earwax removal by a health care provider. This may  be done:  By flushing the ear with body-temperature water.  With a medical instrument that has a loop at the end (earwax curette).  With a suction device. HOME CARE INSTRUCTIONS  Take over-the-counter and prescription medicines only as told by your health care provider.  Do not put any objects, including an ear swab, into your ear. You can clean the opening of your ear canal with a washcloth.  Drink enough water to keep your urine clear or pale yellow.  If you have frequent earwax buildup or you use hearing aids, consider seeing your health care provider every 6-12 months for routine preventive ear cleanings. Keep all follow-up visits as told by your health care provider. SEEK MEDICAL CARE IF:  You have ear pain.  Your condition does not improve with treatment.  You have hearing loss.  You have blood, pus, or other fluid coming from your ear. This information is not intended to replace advice given to you by your health care provider. Make sure you discuss any questions you have with your health care provider. Document Released: 07/28/2004 Document Revised: 10/12/2015 Document Reviewed: 02/04/2015 Elsevier Interactive Patient Education  2017 [employer]. Fluticasone nasal spray What is this medicine? FLUTICASONE (floo TIK a sone) is a corticosteroid. This medicine is used to treat the symptoms of allergies like sneezing, itchy red eyes, and itchy, runny, or stuffy nose. This medicine is also used to treat nasal polyps. This medicine may be used for other purposes; ask your health care provider or pharmacist if you have questions.  COMMON BRAND NAME(S): Flonase, Flonase Allergy Relief, Flonase Sensimist, Veramyst, XHANCE What should I tell my health care provider before I take this medicine? They need to know if you have any of these conditions: -cataracts -glaucoma -infection, like tuberculosis, herpes, or fungal infection -recent surgery on nose or sinuses -taking a  corticosteroid by mouth -an unusual or allergic reaction to fluticasone, steroids, other medicines, foods, dyes, or preservatives -pregnant or trying to get pregnant -breast-feeding How should I use this medicine? This medicine is for use in the nose. Follow the directions on your product or prescription label. This medicine works best if used at regular intervals. Do not use more often than directed. Make sure that you are using your nasal spray correctly. After 6 months of daily use for allergies, talk to your doctor or health care professional before using it for a longer time. Ask your doctor or health care professional if you have any questions. Talk to your pediatrician regarding the use of this medicine in children. Special care may be needed. Some products have been used for allergies in children as young as 2 years. After 2 months of daily use without a prescription in a child, talk to your pediatrician before using it for a longer time. Use of this medicine for nasal polyps is not approved in children. Overdosage: If you think you have taken too much of this medicine contact a poison control center or emergency room at once. NOTE: This medicine is only for you. Do not share this medicine with others. What if I miss a dose? If you miss a dose, use it as soon as you remember. If it is almost time for your next dose, use only that dose and continue with your regular schedule. Do not use double or extra doses. What may interact with this medicine? -certain antibiotics like clarithromycin and telithromycin -certain medicines for fungal infections like ketoconazole, itraconazole, and voriconazole -conivaptan -nefazodone -some medicines for HIV -vaccines This list may not describe all possible interactions. Give your health care provider a list of all the medicines, herbs, non-prescription drugs, or dietary supplements you use. Also tell them if you smoke, drink alcohol, or use illegal drugs.  Some items may interact with your medicine. What should I watch for while using this medicine? Visit your doctor or health care professional for regular checks on your progress. Some symptoms may improve within 12 hours after starting use. Check with your doctor or health care professional if there is no improvement in your symptoms after 3 weeks of use. This medicine may increase your risk of getting an infection. Tell your doctor or health care professional if you are around anyone with measles or chickenpox, or if you develop sores or blisters that do not heal properly. What side effects may I notice from receiving this medicine? Side effects that you should report to your doctor or health care professional as soon as possible: -allergic reactions like skin rash, itching or hives, swelling of the face, lips, or tongue -changes in vision -crusting or sores in the nose -nosebleed -signs and symptoms of infection like fever or chills; cough; sore throat -white patches or sores in the mouth or nose Side effects that usually do not require medical attention (report to your doctor or health care professional if they continue or are bothersome): -burning or irritation inside the nose or throat -cough -headache -unusual taste or smell This list may not describe all possible side effects. Call your doctor for medical advice about  side effects. You may report side effects to FDA at 1-800-FDA-1088. Where should I keep my medicine? Keep out of the reach of children. Store at room temperature between 15 and 30 degrees C (59 and 86 degrees F). Avoid exposure to extreme heat, cold, or light. Throw away any unused medicine after the expiration date. NOTE: This sheet is a summary. It may not cover all possible information. If you have questions about this medicine, talk to your doctor, pharmacist, or health care provider.  2018 Elsevier/Gold Standard (2016-04-01 14:23:12)

## 2016-09-23 DIAGNOSIS — E785 Hyperlipidemia, unspecified: Secondary | ICD-10-CM | POA: Diagnosis not present

## 2016-09-23 DIAGNOSIS — I10 Essential (primary) hypertension: Secondary | ICD-10-CM | POA: Diagnosis not present

## 2016-09-23 DIAGNOSIS — I2510 Atherosclerotic heart disease of native coronary artery without angina pectoris: Secondary | ICD-10-CM | POA: Diagnosis not present

## 2016-09-23 DIAGNOSIS — R7303 Prediabetes: Secondary | ICD-10-CM | POA: Diagnosis not present

## 2016-09-26 DIAGNOSIS — E669 Obesity, unspecified: Secondary | ICD-10-CM | POA: Diagnosis not present

## 2016-09-26 DIAGNOSIS — I10 Essential (primary) hypertension: Secondary | ICD-10-CM | POA: Diagnosis not present

## 2016-09-26 DIAGNOSIS — E785 Hyperlipidemia, unspecified: Secondary | ICD-10-CM | POA: Diagnosis not present

## 2016-09-26 DIAGNOSIS — I2510 Atherosclerotic heart disease of native coronary artery without angina pectoris: Secondary | ICD-10-CM | POA: Diagnosis not present

## 2016-12-26 DIAGNOSIS — I10 Essential (primary) hypertension: Secondary | ICD-10-CM | POA: Diagnosis not present

## 2016-12-26 DIAGNOSIS — E785 Hyperlipidemia, unspecified: Secondary | ICD-10-CM | POA: Diagnosis not present

## 2016-12-26 DIAGNOSIS — E669 Obesity, unspecified: Secondary | ICD-10-CM | POA: Diagnosis not present

## 2016-12-26 DIAGNOSIS — I2510 Atherosclerotic heart disease of native coronary artery without angina pectoris: Secondary | ICD-10-CM | POA: Diagnosis not present

## 2017-04-05 DIAGNOSIS — Z23 Encounter for immunization: Secondary | ICD-10-CM | POA: Diagnosis not present

## 2017-05-08 DIAGNOSIS — E785 Hyperlipidemia, unspecified: Secondary | ICD-10-CM | POA: Diagnosis not present

## 2017-05-08 DIAGNOSIS — I10 Essential (primary) hypertension: Secondary | ICD-10-CM | POA: Diagnosis not present

## 2017-05-08 DIAGNOSIS — R7309 Other abnormal glucose: Secondary | ICD-10-CM | POA: Diagnosis not present

## 2017-05-08 DIAGNOSIS — I2510 Atherosclerotic heart disease of native coronary artery without angina pectoris: Secondary | ICD-10-CM | POA: Diagnosis not present

## 2017-08-21 DIAGNOSIS — E785 Hyperlipidemia, unspecified: Secondary | ICD-10-CM | POA: Diagnosis not present

## 2017-08-21 DIAGNOSIS — R7309 Other abnormal glucose: Secondary | ICD-10-CM | POA: Diagnosis not present

## 2017-08-21 DIAGNOSIS — I2510 Atherosclerotic heart disease of native coronary artery without angina pectoris: Secondary | ICD-10-CM | POA: Diagnosis not present

## 2017-08-21 DIAGNOSIS — I10 Essential (primary) hypertension: Secondary | ICD-10-CM | POA: Diagnosis not present

## 2017-11-29 DIAGNOSIS — R7309 Other abnormal glucose: Secondary | ICD-10-CM | POA: Diagnosis not present

## 2017-11-29 DIAGNOSIS — E785 Hyperlipidemia, unspecified: Secondary | ICD-10-CM | POA: Diagnosis not present

## 2017-11-29 DIAGNOSIS — I2510 Atherosclerotic heart disease of native coronary artery without angina pectoris: Secondary | ICD-10-CM | POA: Diagnosis not present

## 2017-11-29 DIAGNOSIS — I10 Essential (primary) hypertension: Secondary | ICD-10-CM | POA: Diagnosis not present

## 2018-02-26 DIAGNOSIS — E785 Hyperlipidemia, unspecified: Secondary | ICD-10-CM | POA: Diagnosis not present

## 2018-02-26 DIAGNOSIS — R7303 Prediabetes: Secondary | ICD-10-CM | POA: Diagnosis not present

## 2018-02-26 DIAGNOSIS — I10 Essential (primary) hypertension: Secondary | ICD-10-CM | POA: Diagnosis not present

## 2018-02-26 DIAGNOSIS — I2510 Atherosclerotic heart disease of native coronary artery without angina pectoris: Secondary | ICD-10-CM | POA: Diagnosis not present

## 2018-03-23 DIAGNOSIS — E785 Hyperlipidemia, unspecified: Secondary | ICD-10-CM | POA: Diagnosis not present

## 2018-03-23 DIAGNOSIS — I10 Essential (primary) hypertension: Secondary | ICD-10-CM | POA: Diagnosis not present

## 2018-03-23 DIAGNOSIS — R7309 Other abnormal glucose: Secondary | ICD-10-CM | POA: Diagnosis not present

## 2018-03-23 DIAGNOSIS — I2510 Atherosclerotic heart disease of native coronary artery without angina pectoris: Secondary | ICD-10-CM | POA: Diagnosis not present

## 2018-08-15 DIAGNOSIS — E785 Hyperlipidemia, unspecified: Secondary | ICD-10-CM | POA: Diagnosis not present

## 2018-08-15 DIAGNOSIS — I10 Essential (primary) hypertension: Secondary | ICD-10-CM | POA: Diagnosis not present

## 2018-08-15 DIAGNOSIS — R7303 Prediabetes: Secondary | ICD-10-CM | POA: Diagnosis not present

## 2018-08-15 DIAGNOSIS — I2510 Atherosclerotic heart disease of native coronary artery without angina pectoris: Secondary | ICD-10-CM | POA: Diagnosis not present

## 2018-11-21 DIAGNOSIS — I2510 Atherosclerotic heart disease of native coronary artery without angina pectoris: Secondary | ICD-10-CM | POA: Diagnosis not present

## 2018-11-21 DIAGNOSIS — R7303 Prediabetes: Secondary | ICD-10-CM | POA: Diagnosis not present

## 2018-11-21 DIAGNOSIS — I10 Essential (primary) hypertension: Secondary | ICD-10-CM | POA: Diagnosis not present

## 2018-11-21 DIAGNOSIS — E785 Hyperlipidemia, unspecified: Secondary | ICD-10-CM | POA: Diagnosis not present

## 2019-03-13 DIAGNOSIS — I2510 Atherosclerotic heart disease of native coronary artery without angina pectoris: Secondary | ICD-10-CM | POA: Diagnosis not present

## 2019-03-13 DIAGNOSIS — I10 Essential (primary) hypertension: Secondary | ICD-10-CM | POA: Diagnosis not present

## 2019-03-13 DIAGNOSIS — E785 Hyperlipidemia, unspecified: Secondary | ICD-10-CM | POA: Diagnosis not present

## 2019-03-13 DIAGNOSIS — R7303 Prediabetes: Secondary | ICD-10-CM | POA: Diagnosis not present

## 2019-06-17 DIAGNOSIS — I2510 Atherosclerotic heart disease of native coronary artery without angina pectoris: Secondary | ICD-10-CM | POA: Diagnosis not present

## 2019-06-17 DIAGNOSIS — R7303 Prediabetes: Secondary | ICD-10-CM | POA: Diagnosis not present

## 2019-06-17 DIAGNOSIS — E785 Hyperlipidemia, unspecified: Secondary | ICD-10-CM | POA: Diagnosis not present

## 2019-06-17 DIAGNOSIS — I10 Essential (primary) hypertension: Secondary | ICD-10-CM | POA: Diagnosis not present

## 2021-01-06 DIAGNOSIS — E785 Hyperlipidemia, unspecified: Secondary | ICD-10-CM | POA: Diagnosis not present

## 2021-01-06 DIAGNOSIS — I10 Essential (primary) hypertension: Secondary | ICD-10-CM | POA: Diagnosis not present

## 2021-01-06 DIAGNOSIS — R7303 Prediabetes: Secondary | ICD-10-CM | POA: Diagnosis not present

## 2021-01-06 DIAGNOSIS — I2510 Atherosclerotic heart disease of native coronary artery without angina pectoris: Secondary | ICD-10-CM | POA: Diagnosis not present

## 2021-01-07 DIAGNOSIS — E785 Hyperlipidemia, unspecified: Secondary | ICD-10-CM | POA: Diagnosis not present

## 2021-01-07 DIAGNOSIS — I10 Essential (primary) hypertension: Secondary | ICD-10-CM | POA: Diagnosis not present

## 2021-01-07 DIAGNOSIS — I2510 Atherosclerotic heart disease of native coronary artery without angina pectoris: Secondary | ICD-10-CM | POA: Diagnosis not present

## 2021-01-07 DIAGNOSIS — R7303 Prediabetes: Secondary | ICD-10-CM | POA: Diagnosis not present

## 2021-05-10 DIAGNOSIS — F17290 Nicotine dependence, other tobacco product, uncomplicated: Secondary | ICD-10-CM | POA: Diagnosis not present

## 2021-05-10 DIAGNOSIS — E785 Hyperlipidemia, unspecified: Secondary | ICD-10-CM | POA: Diagnosis not present

## 2021-05-10 DIAGNOSIS — I2510 Atherosclerotic heart disease of native coronary artery without angina pectoris: Secondary | ICD-10-CM | POA: Diagnosis not present

## 2021-05-10 DIAGNOSIS — I10 Essential (primary) hypertension: Secondary | ICD-10-CM | POA: Diagnosis not present

## 2021-08-11 DIAGNOSIS — E785 Hyperlipidemia, unspecified: Secondary | ICD-10-CM | POA: Diagnosis not present

## 2021-08-11 DIAGNOSIS — I2510 Atherosclerotic heart disease of native coronary artery without angina pectoris: Secondary | ICD-10-CM | POA: Diagnosis not present

## 2021-08-11 DIAGNOSIS — R7303 Prediabetes: Secondary | ICD-10-CM | POA: Diagnosis not present

## 2021-08-11 DIAGNOSIS — I10 Essential (primary) hypertension: Secondary | ICD-10-CM | POA: Diagnosis not present

## 2021-11-10 DIAGNOSIS — I10 Essential (primary) hypertension: Secondary | ICD-10-CM | POA: Diagnosis not present

## 2021-11-10 DIAGNOSIS — I2510 Atherosclerotic heart disease of native coronary artery without angina pectoris: Secondary | ICD-10-CM | POA: Diagnosis not present

## 2021-11-10 DIAGNOSIS — R7303 Prediabetes: Secondary | ICD-10-CM | POA: Diagnosis not present

## 2021-11-10 DIAGNOSIS — E785 Hyperlipidemia, unspecified: Secondary | ICD-10-CM | POA: Diagnosis not present

## 2022-01-02 DIAGNOSIS — L301 Dyshidrosis [pompholyx]: Secondary | ICD-10-CM | POA: Diagnosis not present

## 2022-02-14 DIAGNOSIS — R7303 Prediabetes: Secondary | ICD-10-CM | POA: Diagnosis not present

## 2022-02-14 DIAGNOSIS — E785 Hyperlipidemia, unspecified: Secondary | ICD-10-CM | POA: Diagnosis not present

## 2022-02-14 DIAGNOSIS — I2510 Atherosclerotic heart disease of native coronary artery without angina pectoris: Secondary | ICD-10-CM | POA: Diagnosis not present

## 2022-02-14 DIAGNOSIS — I10 Essential (primary) hypertension: Secondary | ICD-10-CM | POA: Diagnosis not present

## 2022-06-01 DIAGNOSIS — I10 Essential (primary) hypertension: Secondary | ICD-10-CM | POA: Diagnosis not present

## 2022-06-01 DIAGNOSIS — I2510 Atherosclerotic heart disease of native coronary artery without angina pectoris: Secondary | ICD-10-CM | POA: Diagnosis not present

## 2022-06-01 DIAGNOSIS — E785 Hyperlipidemia, unspecified: Secondary | ICD-10-CM | POA: Diagnosis not present

## 2022-06-01 DIAGNOSIS — R7303 Prediabetes: Secondary | ICD-10-CM | POA: Diagnosis not present

## 2022-06-05 DIAGNOSIS — J441 Chronic obstructive pulmonary disease with (acute) exacerbation: Secondary | ICD-10-CM | POA: Diagnosis not present

## 2022-06-05 DIAGNOSIS — M549 Dorsalgia, unspecified: Secondary | ICD-10-CM | POA: Diagnosis not present

## 2022-06-20 DIAGNOSIS — S39012A Strain of muscle, fascia and tendon of lower back, initial encounter: Secondary | ICD-10-CM | POA: Diagnosis not present

## 2022-06-20 DIAGNOSIS — M62830 Muscle spasm of back: Secondary | ICD-10-CM | POA: Diagnosis not present

## 2022-06-20 DIAGNOSIS — J449 Chronic obstructive pulmonary disease, unspecified: Secondary | ICD-10-CM | POA: Diagnosis not present

## 2022-07-01 DIAGNOSIS — R21 Rash and other nonspecific skin eruption: Secondary | ICD-10-CM | POA: Diagnosis not present

## 2022-07-20 DIAGNOSIS — J029 Acute pharyngitis, unspecified: Secondary | ICD-10-CM | POA: Diagnosis not present

## 2022-07-20 DIAGNOSIS — R509 Fever, unspecified: Secondary | ICD-10-CM | POA: Diagnosis not present

## 2022-07-20 DIAGNOSIS — J209 Acute bronchitis, unspecified: Secondary | ICD-10-CM | POA: Diagnosis not present

## 2022-07-20 DIAGNOSIS — R051 Acute cough: Secondary | ICD-10-CM | POA: Diagnosis not present

## 2022-07-20 DIAGNOSIS — J441 Chronic obstructive pulmonary disease with (acute) exacerbation: Secondary | ICD-10-CM | POA: Diagnosis not present

## 2022-07-20 DIAGNOSIS — J069 Acute upper respiratory infection, unspecified: Secondary | ICD-10-CM | POA: Diagnosis not present

## 2022-07-25 DIAGNOSIS — R051 Acute cough: Secondary | ICD-10-CM | POA: Diagnosis not present

## 2022-07-25 DIAGNOSIS — J029 Acute pharyngitis, unspecified: Secondary | ICD-10-CM | POA: Diagnosis not present

## 2022-07-25 DIAGNOSIS — J069 Acute upper respiratory infection, unspecified: Secondary | ICD-10-CM | POA: Diagnosis not present

## 2022-07-25 DIAGNOSIS — J441 Chronic obstructive pulmonary disease with (acute) exacerbation: Secondary | ICD-10-CM | POA: Diagnosis not present

## 2022-08-17 DIAGNOSIS — I2510 Atherosclerotic heart disease of native coronary artery without angina pectoris: Secondary | ICD-10-CM | POA: Diagnosis not present

## 2022-08-17 DIAGNOSIS — R7303 Prediabetes: Secondary | ICD-10-CM | POA: Diagnosis not present

## 2022-08-17 DIAGNOSIS — E785 Hyperlipidemia, unspecified: Secondary | ICD-10-CM | POA: Diagnosis not present

## 2022-08-17 DIAGNOSIS — I10 Essential (primary) hypertension: Secondary | ICD-10-CM | POA: Diagnosis not present

## 2022-08-25 DIAGNOSIS — E1165 Type 2 diabetes mellitus with hyperglycemia: Secondary | ICD-10-CM | POA: Diagnosis not present

## 2022-08-25 DIAGNOSIS — I10 Essential (primary) hypertension: Secondary | ICD-10-CM | POA: Diagnosis not present

## 2022-08-25 DIAGNOSIS — E785 Hyperlipidemia, unspecified: Secondary | ICD-10-CM | POA: Diagnosis not present

## 2022-10-05 DIAGNOSIS — R635 Abnormal weight gain: Secondary | ICD-10-CM | POA: Diagnosis not present

## 2022-10-05 DIAGNOSIS — Z1211 Encounter for screening for malignant neoplasm of colon: Secondary | ICD-10-CM | POA: Diagnosis not present

## 2022-10-05 DIAGNOSIS — E119 Type 2 diabetes mellitus without complications: Secondary | ICD-10-CM | POA: Diagnosis not present

## 2022-10-05 DIAGNOSIS — Z23 Encounter for immunization: Secondary | ICD-10-CM | POA: Diagnosis not present

## 2022-10-05 DIAGNOSIS — I2510 Atherosclerotic heart disease of native coronary artery without angina pectoris: Secondary | ICD-10-CM | POA: Diagnosis not present

## 2023-01-02 DIAGNOSIS — I2510 Atherosclerotic heart disease of native coronary artery without angina pectoris: Secondary | ICD-10-CM | POA: Diagnosis not present

## 2023-01-02 DIAGNOSIS — E782 Mixed hyperlipidemia: Secondary | ICD-10-CM | POA: Diagnosis not present

## 2023-01-02 DIAGNOSIS — I10 Essential (primary) hypertension: Secondary | ICD-10-CM | POA: Diagnosis not present

## 2023-01-02 DIAGNOSIS — E119 Type 2 diabetes mellitus without complications: Secondary | ICD-10-CM | POA: Diagnosis not present

## 2023-02-02 DIAGNOSIS — S91302A Unspecified open wound, left foot, initial encounter: Secondary | ICD-10-CM | POA: Diagnosis not present

## 2023-02-02 DIAGNOSIS — E118 Type 2 diabetes mellitus with unspecified complications: Secondary | ICD-10-CM | POA: Diagnosis not present

## 2023-02-15 DIAGNOSIS — M79604 Pain in right leg: Secondary | ICD-10-CM | POA: Diagnosis not present

## 2023-02-15 DIAGNOSIS — L988 Other specified disorders of the skin and subcutaneous tissue: Secondary | ICD-10-CM | POA: Diagnosis not present

## 2023-02-17 ENCOUNTER — Other Ambulatory Visit: Payer: Self-pay | Admitting: Physician Assistant

## 2023-02-17 ENCOUNTER — Ambulatory Visit
Admission: RE | Admit: 2023-02-17 | Discharge: 2023-02-17 | Disposition: A | Discharge status: Home / Self Care | Payer: BC Managed Care – PPO | Source: Ambulatory Visit | Attending: Physician Assistant | Admitting: Physician Assistant

## 2023-02-17 DIAGNOSIS — M79604 Pain in right leg: Secondary | ICD-10-CM

## 2023-02-17 DIAGNOSIS — M25561 Pain in right knee: Secondary | ICD-10-CM | POA: Diagnosis not present

## 2023-02-17 DIAGNOSIS — M1711 Unilateral primary osteoarthritis, right knee: Secondary | ICD-10-CM | POA: Diagnosis not present

## 2023-02-17 DIAGNOSIS — M25461 Effusion, right knee: Secondary | ICD-10-CM | POA: Diagnosis not present

## 2023-03-30 DIAGNOSIS — Z0000 Encounter for general adult medical examination without abnormal findings: Secondary | ICD-10-CM | POA: Diagnosis not present

## 2023-03-30 DIAGNOSIS — Z125 Encounter for screening for malignant neoplasm of prostate: Secondary | ICD-10-CM | POA: Diagnosis not present

## 2023-03-30 DIAGNOSIS — E785 Hyperlipidemia, unspecified: Secondary | ICD-10-CM | POA: Diagnosis not present

## 2023-03-30 DIAGNOSIS — E1169 Type 2 diabetes mellitus with other specified complication: Secondary | ICD-10-CM | POA: Diagnosis not present

## 2023-04-12 DIAGNOSIS — I10 Essential (primary) hypertension: Secondary | ICD-10-CM | POA: Diagnosis not present

## 2023-04-12 DIAGNOSIS — E119 Type 2 diabetes mellitus without complications: Secondary | ICD-10-CM | POA: Diagnosis not present

## 2023-04-12 DIAGNOSIS — I2510 Atherosclerotic heart disease of native coronary artery without angina pectoris: Secondary | ICD-10-CM | POA: Diagnosis not present

## 2023-04-12 DIAGNOSIS — E782 Mixed hyperlipidemia: Secondary | ICD-10-CM | POA: Diagnosis not present

## 2023-05-30 DIAGNOSIS — E1169 Type 2 diabetes mellitus with other specified complication: Secondary | ICD-10-CM | POA: Diagnosis not present

## 2023-05-30 DIAGNOSIS — J449 Chronic obstructive pulmonary disease, unspecified: Secondary | ICD-10-CM | POA: Diagnosis not present

## 2023-05-30 DIAGNOSIS — I2510 Atherosclerotic heart disease of native coronary artery without angina pectoris: Secondary | ICD-10-CM | POA: Diagnosis not present

## 2023-05-30 DIAGNOSIS — I10 Essential (primary) hypertension: Secondary | ICD-10-CM | POA: Diagnosis not present

## 2023-07-26 DIAGNOSIS — E782 Mixed hyperlipidemia: Secondary | ICD-10-CM | POA: Diagnosis not present

## 2023-07-26 DIAGNOSIS — E119 Type 2 diabetes mellitus without complications: Secondary | ICD-10-CM | POA: Diagnosis not present

## 2023-07-26 DIAGNOSIS — I10 Essential (primary) hypertension: Secondary | ICD-10-CM | POA: Diagnosis not present

## 2023-07-26 DIAGNOSIS — I2510 Atherosclerotic heart disease of native coronary artery without angina pectoris: Secondary | ICD-10-CM | POA: Diagnosis not present

## 2023-10-17 DIAGNOSIS — J449 Chronic obstructive pulmonary disease, unspecified: Secondary | ICD-10-CM | POA: Diagnosis not present

## 2023-10-17 DIAGNOSIS — I2510 Atherosclerotic heart disease of native coronary artery without angina pectoris: Secondary | ICD-10-CM | POA: Diagnosis not present

## 2023-10-17 DIAGNOSIS — E669 Obesity, unspecified: Secondary | ICD-10-CM | POA: Diagnosis not present

## 2023-11-02 DIAGNOSIS — E785 Hyperlipidemia, unspecified: Secondary | ICD-10-CM | POA: Diagnosis not present

## 2023-11-02 DIAGNOSIS — I10 Essential (primary) hypertension: Secondary | ICD-10-CM | POA: Diagnosis not present

## 2023-11-02 DIAGNOSIS — J449 Chronic obstructive pulmonary disease, unspecified: Secondary | ICD-10-CM | POA: Diagnosis not present

## 2023-11-02 DIAGNOSIS — E1169 Type 2 diabetes mellitus with other specified complication: Secondary | ICD-10-CM | POA: Diagnosis not present

## 2023-11-28 DIAGNOSIS — K219 Gastro-esophageal reflux disease without esophagitis: Secondary | ICD-10-CM | POA: Diagnosis not present

## 2023-12-11 DIAGNOSIS — K648 Other hemorrhoids: Secondary | ICD-10-CM | POA: Diagnosis not present

## 2023-12-11 DIAGNOSIS — D123 Benign neoplasm of transverse colon: Secondary | ICD-10-CM | POA: Diagnosis not present

## 2023-12-11 DIAGNOSIS — K5520 Angiodysplasia of colon without hemorrhage: Secondary | ICD-10-CM | POA: Diagnosis not present

## 2023-12-11 DIAGNOSIS — D124 Benign neoplasm of descending colon: Secondary | ICD-10-CM | POA: Diagnosis not present

## 2023-12-11 DIAGNOSIS — Z1211 Encounter for screening for malignant neoplasm of colon: Secondary | ICD-10-CM | POA: Diagnosis not present

## 2023-12-11 DIAGNOSIS — K5730 Diverticulosis of large intestine without perforation or abscess without bleeding: Secondary | ICD-10-CM | POA: Diagnosis not present

## 2024-01-23 DIAGNOSIS — E782 Mixed hyperlipidemia: Secondary | ICD-10-CM | POA: Diagnosis not present

## 2024-01-23 DIAGNOSIS — I10 Essential (primary) hypertension: Secondary | ICD-10-CM | POA: Diagnosis not present

## 2024-01-23 DIAGNOSIS — E119 Type 2 diabetes mellitus without complications: Secondary | ICD-10-CM | POA: Diagnosis not present

## 2024-01-23 DIAGNOSIS — I2510 Atherosclerotic heart disease of native coronary artery without angina pectoris: Secondary | ICD-10-CM | POA: Diagnosis not present

## 2024-01-30 DIAGNOSIS — I2510 Atherosclerotic heart disease of native coronary artery without angina pectoris: Secondary | ICD-10-CM | POA: Diagnosis not present

## 2024-01-30 DIAGNOSIS — I10 Essential (primary) hypertension: Secondary | ICD-10-CM | POA: Diagnosis not present

## 2024-01-30 DIAGNOSIS — E119 Type 2 diabetes mellitus without complications: Secondary | ICD-10-CM | POA: Diagnosis not present

## 2024-01-30 DIAGNOSIS — E785 Hyperlipidemia, unspecified: Secondary | ICD-10-CM | POA: Diagnosis not present

## 2024-04-02 DIAGNOSIS — J449 Chronic obstructive pulmonary disease, unspecified: Secondary | ICD-10-CM | POA: Diagnosis not present

## 2024-04-02 DIAGNOSIS — E1169 Type 2 diabetes mellitus with other specified complication: Secondary | ICD-10-CM | POA: Diagnosis not present

## 2024-04-02 DIAGNOSIS — E785 Hyperlipidemia, unspecified: Secondary | ICD-10-CM | POA: Diagnosis not present

## 2024-04-02 DIAGNOSIS — Z7984 Long term (current) use of oral hypoglycemic drugs: Secondary | ICD-10-CM | POA: Diagnosis not present

## 2024-04-02 DIAGNOSIS — Z0000 Encounter for general adult medical examination without abnormal findings: Secondary | ICD-10-CM | POA: Diagnosis not present

## 2024-04-02 DIAGNOSIS — I10 Essential (primary) hypertension: Secondary | ICD-10-CM | POA: Diagnosis not present

## 2024-04-18 DIAGNOSIS — H6123 Impacted cerumen, bilateral: Secondary | ICD-10-CM | POA: Diagnosis not present

## 2024-05-01 DIAGNOSIS — E119 Type 2 diabetes mellitus without complications: Secondary | ICD-10-CM | POA: Diagnosis not present

## 2024-05-01 DIAGNOSIS — I2510 Atherosclerotic heart disease of native coronary artery without angina pectoris: Secondary | ICD-10-CM | POA: Diagnosis not present

## 2024-05-01 DIAGNOSIS — I10 Essential (primary) hypertension: Secondary | ICD-10-CM | POA: Diagnosis not present

## 2024-05-01 DIAGNOSIS — E782 Mixed hyperlipidemia: Secondary | ICD-10-CM | POA: Diagnosis not present
